# Patient Record
Sex: Male | Born: 1975 | Race: White | Hispanic: No | Marital: Married | Smoking: Former smoker
Health system: Southern US, Community
[De-identification: ages and names within clinical notes are randomized; demographics above are authoritative.]

## PROBLEM LIST (undated history)

## (undated) DIAGNOSIS — N200 Calculus of kidney: Secondary | ICD-10-CM

## (undated) DIAGNOSIS — Z87442 Personal history of urinary calculi: Secondary | ICD-10-CM

## (undated) DIAGNOSIS — F988 Other specified behavioral and emotional disorders with onset usually occurring in childhood and adolescence: Secondary | ICD-10-CM

---

## 1996-04-25 HISTORY — PX: ORIF FOREARM FRACTURE: SHX2124

## 1999-04-16 ENCOUNTER — Encounter: Payer: Self-pay | Admitting: *Deleted

## 1999-04-16 ENCOUNTER — Encounter: Admission: RE | Admit: 1999-04-16 | Discharge: 1999-04-16 | Payer: Self-pay | Admitting: *Deleted

## 2001-01-03 HISTORY — PX: APPENDECTOMY: SHX54

## 2001-04-25 HISTORY — PX: OTHER SURGICAL HISTORY: SHX169

## 2003-04-24 ENCOUNTER — Emergency Department (HOSPITAL_COMMUNITY): Admission: EM | Admit: 2003-04-24 | Discharge: 2003-04-24 | Payer: Self-pay | Admitting: Emergency Medicine

## 2015-12-18 ENCOUNTER — Other Ambulatory Visit (HOSPITAL_COMMUNITY): Payer: Self-pay | Admitting: Family Medicine

## 2015-12-18 ENCOUNTER — Ambulatory Visit (HOSPITAL_COMMUNITY)
Admission: RE | Admit: 2015-12-18 | Discharge: 2015-12-18 | Disposition: A | Discharge status: Home / Self Care | Payer: BLUE CROSS/BLUE SHIELD | Source: Ambulatory Visit | Attending: Family Medicine | Admitting: Family Medicine

## 2015-12-18 DIAGNOSIS — M25552 Pain in left hip: Secondary | ICD-10-CM

## 2015-12-18 DIAGNOSIS — G4700 Insomnia, unspecified: Secondary | ICD-10-CM | POA: Insufficient documentation

## 2016-01-05 ENCOUNTER — Other Ambulatory Visit (HOSPITAL_COMMUNITY): Payer: Self-pay | Admitting: Family Medicine

## 2016-01-05 DIAGNOSIS — M25552 Pain in left hip: Secondary | ICD-10-CM

## 2016-03-25 ENCOUNTER — Other Ambulatory Visit: Payer: Self-pay | Admitting: Physician Assistant

## 2016-03-25 ENCOUNTER — Other Ambulatory Visit (HOSPITAL_COMMUNITY): Payer: Self-pay | Admitting: Physician Assistant

## 2016-03-25 DIAGNOSIS — M795 Residual foreign body in soft tissue: Secondary | ICD-10-CM

## 2016-03-25 DIAGNOSIS — M5442 Lumbago with sciatica, left side: Secondary | ICD-10-CM

## 2016-03-25 DIAGNOSIS — M5441 Lumbago with sciatica, right side: Secondary | ICD-10-CM

## 2016-03-25 DIAGNOSIS — M25552 Pain in left hip: Secondary | ICD-10-CM

## 2016-03-28 ENCOUNTER — Other Ambulatory Visit (HOSPITAL_COMMUNITY): Payer: Self-pay | Admitting: Physician Assistant

## 2016-03-28 DIAGNOSIS — M5416 Radiculopathy, lumbar region: Secondary | ICD-10-CM

## 2016-03-30 ENCOUNTER — Ambulatory Visit (HOSPITAL_COMMUNITY): Payer: BLUE CROSS/BLUE SHIELD

## 2016-12-24 DIAGNOSIS — F988 Other specified behavioral and emotional disorders with onset usually occurring in childhood and adolescence: Secondary | ICD-10-CM

## 2016-12-24 HISTORY — DX: Other specified behavioral and emotional disorders with onset usually occurring in childhood and adolescence: F98.8

## 2017-02-07 ENCOUNTER — Emergency Department (HOSPITAL_COMMUNITY): Payer: BLUE CROSS/BLUE SHIELD

## 2017-02-07 ENCOUNTER — Encounter (HOSPITAL_COMMUNITY): Payer: Self-pay | Admitting: Emergency Medicine

## 2017-02-07 ENCOUNTER — Emergency Department (HOSPITAL_COMMUNITY)
Admission: EM | Admit: 2017-02-07 | Discharge: 2017-02-07 | Disposition: A | Discharge status: Home / Self Care | Payer: BLUE CROSS/BLUE SHIELD | Attending: Emergency Medicine | Admitting: Emergency Medicine

## 2017-02-07 DIAGNOSIS — Z79899 Other long term (current) drug therapy: Secondary | ICD-10-CM | POA: Diagnosis not present

## 2017-02-07 DIAGNOSIS — N200 Calculus of kidney: Secondary | ICD-10-CM | POA: Diagnosis not present

## 2017-02-07 DIAGNOSIS — R109 Unspecified abdominal pain: Secondary | ICD-10-CM | POA: Diagnosis present

## 2017-02-07 DIAGNOSIS — Z87891 Personal history of nicotine dependence: Secondary | ICD-10-CM | POA: Diagnosis not present

## 2017-02-07 HISTORY — DX: Calculus of kidney: N20.0

## 2017-02-07 LAB — BASIC METABOLIC PANEL
ANION GAP: 10 (ref 5–15)
BUN: 15 mg/dL (ref 6–20)
CHLORIDE: 105 mmol/L (ref 101–111)
CO2: 24 mmol/L (ref 22–32)
Calcium: 9.3 mg/dL (ref 8.9–10.3)
Creatinine, Ser: 1.41 mg/dL — ABNORMAL HIGH (ref 0.61–1.24)
GFR, EST NON AFRICAN AMERICAN: 60 mL/min — AB (ref >60)
Glucose, Bld: 123 mg/dL — ABNORMAL HIGH (ref 65–99)
Potassium: 3.7 mmol/L (ref 3.5–5.1)
SODIUM: 139 mmol/L (ref 135–145)

## 2017-02-07 LAB — CBC WITH DIFFERENTIAL/PLATELET
BASOS ABS: 0 10*3/uL (ref 0.0–0.1)
BASOS PCT: 0 %
EOS ABS: 0 10*3/uL (ref 0.0–0.7)
Eosinophils Relative: 1 %
HEMATOCRIT: 42.7 % (ref 39.0–52.0)
HEMOGLOBIN: 14.6 g/dL (ref 13.0–17.0)
Lymphocytes Relative: 9 %
Lymphs Abs: 0.7 10*3/uL (ref 0.7–4.0)
MCH: 30 pg (ref 26.0–34.0)
MCHC: 34.2 g/dL (ref 30.0–36.0)
MCV: 87.9 fL (ref 78.0–100.0)
MONOS PCT: 5 %
Monocytes Absolute: 0.4 10*3/uL (ref 0.1–1.0)
NEUTROS ABS: 7.2 10*3/uL (ref 1.7–7.7)
NEUTROS PCT: 85 %
Platelets: 156 10*3/uL (ref 150–400)
RBC: 4.86 MIL/uL (ref 4.22–5.81)
RDW: 12.9 % (ref 11.5–15.5)
WBC: 8.4 10*3/uL (ref 4.0–10.5)

## 2017-02-07 LAB — URINALYSIS, ROUTINE W REFLEX MICROSCOPIC
Bacteria, UA: NONE SEEN
Bilirubin Urine: NEGATIVE
Glucose, UA: NEGATIVE mg/dL
Hgb urine dipstick: NEGATIVE
Ketones, ur: 5 mg/dL — AB
Leukocytes, UA: NEGATIVE
Nitrite: NEGATIVE
Protein, ur: 30 mg/dL — AB
Specific Gravity, Urine: 1.028 (ref 1.005–1.030)
Squamous Epithelial / LPF: NONE SEEN
pH: 5 (ref 5.0–8.0)

## 2017-02-07 MED ORDER — ONDANSETRON HCL 4 MG/2ML IJ SOLN
4.0000 mg | Freq: Once | INTRAMUSCULAR | Status: AC
  Administered 2017-02-07: 4 mg via INTRAVENOUS
  Filled 2017-02-07: qty 2

## 2017-02-07 MED ORDER — OXYCODONE-ACETAMINOPHEN 5-325 MG PO TABS
ORAL_TABLET | ORAL | Status: AC
  Filled 2017-02-07: qty 2

## 2017-02-07 MED ORDER — OXYCODONE-ACETAMINOPHEN 5-325 MG PO TABS
1.0000 | ORAL_TABLET | Freq: Once | ORAL | Status: AC
  Administered 2017-02-07: 1 via ORAL
  Filled 2017-02-07: qty 1

## 2017-02-07 MED ORDER — HYDROMORPHONE HCL 1 MG/ML IJ SOLN
1.0000 mg | INTRAMUSCULAR | Status: DC | PRN
  Administered 2017-02-07: 1 mg via INTRAVENOUS
  Filled 2017-02-07: qty 1

## 2017-02-07 MED ORDER — TAMSULOSIN HCL 0.4 MG PO CAPS: 0.4000 mg | ORAL_CAPSULE | Freq: Every day | ORAL | 0 refills | Status: DC

## 2017-02-07 MED ORDER — HYDROMORPHONE HCL 1 MG/ML IJ SOLN
1.0000 mg | Freq: Once | INTRAMUSCULAR | Status: AC
  Administered 2017-02-07: 1 mg via INTRAVENOUS
  Filled 2017-02-07: qty 1

## 2017-02-07 MED ORDER — OXYCODONE-ACETAMINOPHEN 5-325 MG PO TABS
2.0000 | ORAL_TABLET | Freq: Once | ORAL | Status: AC
  Administered 2017-02-07: 2 via ORAL

## 2017-02-07 MED ORDER — OXYCODONE-ACETAMINOPHEN 5-325 MG PO TABS: 2.0000 | ORAL_TABLET | ORAL | 0 refills | Status: DC | PRN

## 2017-02-07 MED ORDER — HYDROMORPHONE HCL 1 MG/ML IJ SOLN
0.5000 mg | Freq: Once | INTRAMUSCULAR | Status: AC
  Administered 2017-02-07: 0.5 mg via INTRAVENOUS
  Filled 2017-02-07: qty 1

## 2017-02-07 MED ORDER — ONDANSETRON 4 MG PO TBDP: 4.0000 mg | ORAL_TABLET | Freq: Three times a day (TID) | ORAL | 0 refills | Status: DC | PRN

## 2017-02-07 NOTE — ED Notes (Signed)
Patient given discharge instructions and verbalized understanding.  Patient stable to discharge at this time.  Patient is alert and oriented to baseline.  No distressed noted at this time.  All belongings taken with the patient at discharge.   

## 2017-02-07 NOTE — Discharge Instructions (Signed)
Call Dr. Alphonsa Overall office [facility]) for appointment. Tell the office that Dr. Fayrene Fearing spoke with Dr. Liliane Shi Re: Next/first available provider appointment., Tylenol OR percocet for pain Push fluids.

## 2017-02-07 NOTE — ED Triage Notes (Signed)
Pt sts that he has kidney stones with [place]. Pt sts he has not passed the stone and has the same pain which has increased. Pt sts that he has difficulty and pain with urination. Pain is on L side through to back.

## 2017-02-07 NOTE — ED Notes (Signed)
Pt States having a ride in the lobby to take him home

## 2017-02-08 ENCOUNTER — Ambulatory Visit (HOSPITAL_COMMUNITY)
Admission: AD | Admit: 2017-02-08 | Discharge: 2017-02-08 | Disposition: A | Discharge status: Home / Self Care | Payer: BLUE CROSS/BLUE SHIELD | Source: Ambulatory Visit | Attending: Urology | Admitting: Urology

## 2017-02-08 ENCOUNTER — Ambulatory Visit (HOSPITAL_COMMUNITY): Payer: BLUE CROSS/BLUE SHIELD

## 2017-02-08 ENCOUNTER — Inpatient Hospital Stay (HOSPITAL_COMMUNITY): Payer: BLUE CROSS/BLUE SHIELD | Admitting: Certified Registered Nurse Anesthetist

## 2017-02-08 ENCOUNTER — Other Ambulatory Visit: Payer: Self-pay | Admitting: Urology

## 2017-02-08 ENCOUNTER — Encounter (HOSPITAL_COMMUNITY)
Admission: AD | Disposition: A | Discharge status: Home / Self Care | Payer: Self-pay | Source: Ambulatory Visit | Attending: Urology

## 2017-02-08 ENCOUNTER — Encounter (HOSPITAL_COMMUNITY): Payer: Self-pay | Admitting: *Deleted

## 2017-02-08 DIAGNOSIS — Z87891 Personal history of nicotine dependence: Secondary | ICD-10-CM | POA: Diagnosis not present

## 2017-02-08 DIAGNOSIS — N209 Urinary calculus, unspecified: Secondary | ICD-10-CM | POA: Diagnosis present

## 2017-02-08 DIAGNOSIS — N201 Calculus of ureter: Secondary | ICD-10-CM | POA: Diagnosis not present

## 2017-02-08 DIAGNOSIS — Z841 Family history of disorders of kidney and ureter: Secondary | ICD-10-CM | POA: Insufficient documentation

## 2017-02-08 DIAGNOSIS — Z823 Family history of stroke: Secondary | ICD-10-CM | POA: Diagnosis not present

## 2017-02-08 DIAGNOSIS — Z79899 Other long term (current) drug therapy: Secondary | ICD-10-CM | POA: Diagnosis not present

## 2017-02-08 HISTORY — PX: CYSTOSCOPY/RETROGRADE/URETEROSCOPY/STONE EXTRACTION WITH BASKET: SHX5317

## 2017-02-08 HISTORY — PX: HOLMIUM LASER APPLICATION: SHX5852

## 2017-02-08 HISTORY — DX: Personal history of urinary calculi: Z87.442

## 2017-02-08 HISTORY — DX: Other specified behavioral and emotional disorders with onset usually occurring in childhood and adolescence: F98.8

## 2017-02-08 LAB — BASIC METABOLIC PANEL
Anion gap: 10 (ref 5–15)
BUN: 15 mg/dL (ref 6–20)
CO2: 26 mmol/L (ref 22–32)
Calcium: 9.1 mg/dL (ref 8.9–10.3)
Chloride: 105 mmol/L (ref 101–111)
Creatinine, Ser: 1.37 mg/dL — ABNORMAL HIGH (ref 0.61–1.24)
GFR calc Af Amer: >60 mL/min (ref >60)
GLUCOSE: 98 mg/dL (ref 65–99)
POTASSIUM: 3.9 mmol/L (ref 3.5–5.1)
Sodium: 141 mmol/L (ref 135–145)

## 2017-02-08 SURGERY — CYSTOSCOPY, WITH CALCULUS REMOVAL USING BASKET
Anesthesia: General | Site: Bladder | Laterality: Left

## 2017-02-08 MED ORDER — PROPOFOL 10 MG/ML IV BOLUS
INTRAVENOUS | Status: DC | PRN
  Administered 2017-02-08: 200 mg via INTRAVENOUS
  Administered 2017-02-08: 100 mg via INTRAVENOUS

## 2017-02-08 MED ORDER — HYDROMORPHONE HCL-NACL 0.5-0.9 MG/ML-% IV SOSY
0.2500 mg | PREFILLED_SYRINGE | INTRAVENOUS | Status: DC | PRN
  Administered 2017-02-08: 0.5 mg via INTRAVENOUS

## 2017-02-08 MED ORDER — HYDROMORPHONE HCL-NACL 0.5-0.9 MG/ML-% IV SOSY
PREFILLED_SYRINGE | INTRAVENOUS | Status: AC
  Filled 2017-02-08: qty 1

## 2017-02-08 MED ORDER — DEXAMETHASONE SODIUM PHOSPHATE 10 MG/ML IJ SOLN
INTRAMUSCULAR | Status: AC
  Filled 2017-02-08: qty 1

## 2017-02-08 MED ORDER — FENTANYL CITRATE (PF) 100 MCG/2ML IJ SOLN
INTRAMUSCULAR | Status: DC | PRN
  Administered 2017-02-08 (×2): 50 ug via INTRAVENOUS

## 2017-02-08 MED ORDER — MIDAZOLAM HCL 2 MG/2ML IJ SOLN
INTRAMUSCULAR | Status: AC
  Filled 2017-02-08: qty 2

## 2017-02-08 MED ORDER — PROMETHAZINE HCL 25 MG/ML IJ SOLN: 6.2500 mg | INTRAMUSCULAR | Status: DC | PRN

## 2017-02-08 MED ORDER — OXYCODONE HCL 5 MG PO TABS: 5.0000 mg | ORAL_TABLET | Freq: Three times a day (TID) | ORAL | 0 refills | Status: AC | PRN

## 2017-02-08 MED ORDER — LIDOCAINE HCL 1 % IJ SOLN
INTRAMUSCULAR | Status: DC | PRN
  Administered 2017-02-08: 50 mg via INTRADERMAL

## 2017-02-08 MED ORDER — LACTATED RINGERS IV SOLN
INTRAVENOUS | Status: DC
  Administered 2017-02-08: 17:00:00 via INTRAVENOUS

## 2017-02-08 MED ORDER — OXYCODONE HCL 5 MG PO TABS
ORAL_TABLET | ORAL | Status: AC
  Filled 2017-02-08: qty 1

## 2017-02-08 MED ORDER — ONDANSETRON HCL 4 MG/2ML IJ SOLN
INTRAMUSCULAR | Status: DC | PRN
  Administered 2017-02-08: 4 mg via INTRAVENOUS

## 2017-02-08 MED ORDER — LIDOCAINE 2% (20 MG/ML) 5 ML SYRINGE
INTRAMUSCULAR | Status: AC
  Filled 2017-02-08: qty 5

## 2017-02-08 MED ORDER — DEXAMETHASONE SODIUM PHOSPHATE 10 MG/ML IJ SOLN
INTRAMUSCULAR | Status: DC | PRN
  Administered 2017-02-08: 10 mg via INTRAVENOUS

## 2017-02-08 MED ORDER — PROPOFOL 10 MG/ML IV BOLUS
INTRAVENOUS | Status: AC
  Filled 2017-02-08: qty 20

## 2017-02-08 MED ORDER — CEFAZOLIN SODIUM-DEXTROSE 2-4 GM/100ML-% IV SOLN
2.0000 g | Freq: Once | INTRAVENOUS | Status: AC
  Administered 2017-02-08: 2 g via INTRAVENOUS
  Filled 2017-02-08: qty 100

## 2017-02-08 MED ORDER — OXYCODONE HCL 5 MG/5ML PO SOLN: 5.0000 mg | Freq: Once | ORAL | Status: AC | PRN

## 2017-02-08 MED ORDER — PHENAZOPYRIDINE HCL 100 MG PO TABS: 100.0000 mg | ORAL_TABLET | Freq: Three times a day (TID) | ORAL | 0 refills | Status: DC | PRN

## 2017-02-08 MED ORDER — OXYCODONE HCL 5 MG PO TABS
5.0000 mg | ORAL_TABLET | Freq: Once | ORAL | Status: AC | PRN
  Administered 2017-02-08: 5 mg via ORAL

## 2017-02-08 MED ORDER — MIDAZOLAM HCL 5 MG/5ML IJ SOLN
INTRAMUSCULAR | Status: DC | PRN
  Administered 2017-02-08: 2 mg via INTRAVENOUS

## 2017-02-08 MED ORDER — ONDANSETRON HCL 4 MG/2ML IJ SOLN
INTRAMUSCULAR | Status: AC
  Filled 2017-02-08: qty 2

## 2017-02-08 MED ORDER — FENTANYL CITRATE (PF) 100 MCG/2ML IJ SOLN
INTRAMUSCULAR | Status: AC
  Filled 2017-02-08: qty 2

## 2017-02-08 MED ORDER — IOHEXOL 300 MG/ML  SOLN
INTRAMUSCULAR | Status: DC | PRN
  Administered 2017-02-08: 3 mL via INTRAVENOUS

## 2017-02-08 SURGICAL SUPPLY — 20 items
BAG URO CATCHER STRL LF (MISCELLANEOUS) ×3 IMPLANT
BASKET ZERO TIP NITINOL 2.4FR (BASKET) ×3 IMPLANT
CATH INTERMIT  6FR 70CM (CATHETERS) IMPLANT
CLOTH BEACON ORANGE TIMEOUT ST (SAFETY) ×3 IMPLANT
COVER FOOTSWITCH UNIV (MISCELLANEOUS) IMPLANT
COVER SURGICAL LIGHT HANDLE (MISCELLANEOUS) ×3 IMPLANT
FIBER LASER FLEXIVA 365 (UROLOGICAL SUPPLIES) ×3 IMPLANT
FIBER LASER TRAC TIP (UROLOGICAL SUPPLIES) IMPLANT
GLOVE BIOGEL M STRL SZ7.5 (GLOVE) ×3 IMPLANT
GOWN STRL REUS W/TWL LRG LVL3 (GOWN DISPOSABLE) ×6 IMPLANT
GUIDEWIRE ANG ZIPWIRE 038X150 (WIRE) IMPLANT
GUIDEWIRE STR DUAL SENSOR (WIRE) ×3 IMPLANT
IV NS 1000ML (IV SOLUTION) ×2
IV NS 1000ML BAXH (IV SOLUTION) ×1 IMPLANT
MANIFOLD NEPTUNE II (INSTRUMENTS) ×3 IMPLANT
PACK CYSTO (CUSTOM PROCEDURE TRAY) ×3 IMPLANT
SHEATH ACCESS URETERAL 38CM (SHEATH) IMPLANT
STENT URET 6FRX26 CONTOUR (STENTS) ×3 IMPLANT
TUBING CONNECTING 10 (TUBING) ×2 IMPLANT
TUBING CONNECTING 10' (TUBING) ×1

## 2017-02-08 NOTE — Anesthesia Postprocedure Evaluation (Signed)
Anesthesia Post Note  Patient: Donald ChromanJoseph N Peck  Procedure(s) Performed: CYSTOSCOPY/LEFT RETROGRADE/LEFT URETEROSCOPY/STONE EXTRACTION WITH BASKET/LEFT URETERAL STENT PLACEMENT (Left Bladder) HOLMIUM LASER APPLICATION (Left Bladder)     Patient location during evaluation: PACU Anesthesia Type: General Level of consciousness: awake and alert Pain management: pain level controlled Vital Signs Assessment: post-procedure vital signs reviewed and stable Respiratory status: spontaneous breathing, nonlabored ventilation and respiratory function stable Cardiovascular status: blood pressure returned to baseline and stable Postop Assessment: no apparent nausea or vomiting Anesthetic complications: no    Last Vitals:  Vitals:   02/08/17 1908 02/08/17 1930  BP: (!) 136/98 (!) 134/94  Pulse: 76 61  Resp: 18 17  Temp: 36.4 C   SpO2: 100% 97%    Last Pain:  Vitals:   02/08/17 1930  TempSrc:   PainSc: 5                  Lowella CurbWarren Ray Jaquanna Ballentine

## 2017-02-08 NOTE — Anesthesia Preprocedure Evaluation (Signed)
Anesthesia Evaluation  Patient identified by MRN, date of birth, ID band Patient awake    Reviewed: Allergy & Precautions, NPO status , Patient's Chart, lab work & pertinent test results  Airway Mallampati: II  TM Distance: >3 FB Neck ROM: Full    Dental no notable dental hx.    Pulmonary neg pulmonary ROS, former smoker,    Pulmonary exam normal breath sounds clear to auscultation       Cardiovascular negative cardio ROS Normal cardiovascular exam Rhythm:Regular Rate:Normal     Neuro/Psych Depression negative neurological ROS  negative psych ROS   GI/Hepatic negative GI ROS, Neg liver ROS,   Endo/Other  negative endocrine ROS  Renal/GU Renal diseasenegative Renal ROS  negative genitourinary   Musculoskeletal negative musculoskeletal ROS (+)   Abdominal   Peds negative pediatric ROS (+)  Hematology negative hematology ROS (+)   Anesthesia Other Findings   Reproductive/Obstetrics negative OB ROS                             Anesthesia Physical Anesthesia Plan  ASA: II  Anesthesia Plan: General   Post-op Pain Management:    Induction: Intravenous  PONV Risk Score and Plan: 2 and Ondansetron and Midazolam  Airway Management Planned: LMA  Additional Equipment:   Intra-op Plan:   Post-operative Plan: Extubation in OR  Informed Consent: I have reviewed the patients History and Physical, chart, labs and discussed the procedure including the risks, benefits and alternatives for the proposed anesthesia with the patient or authorized representative who has indicated his/her understanding and acceptance.   Dental advisory given  Plan Discussed with: CRNA  Anesthesia Plan Comments:         Anesthesia Quick Evaluation

## 2017-02-08 NOTE — Op Note (Signed)
Preoperative diagnosis: Left ureteral calculus  Postoperative diagnosis: Left ureteral calculus  Procedure:  1. Cystoscopy 2. Left ureteroscopy and stone removal 3. Ureteroscopic laser lithotripsy 4. Left ureteral stent placement (6 x 26 with string)  5. Left retrograde pyelography with interpretation  Surgeon: Moody BruinsLester S. Issis Lindseth, Jr. M.D.  Anesthesia: General  Complications: None  Intraoperative findings: Left retrograde pyelography demonstrated a filling defect within the distal left ureter consistent with the patient's known calculus without other abnormalities.  EBL: Minimal  Specimens: 1. Left ureteral calculus  Disposition of specimens: [facility] for stone analysis  Indication: Loman ChromanJoseph N Everingham is a 41 y.o. year old patient with urolithiasis.  He has a symptomatic distal left ureteral stone. After reviewing the management options for treatment, the patient elected to proceed with the above surgical procedure(s). We have discussed the potential benefits and risks of the procedure, side effects of the proposed treatment, the likelihood of the patient achieving the goals of the procedure, and any potential problems that might occur during the procedure or recuperation. Informed consent has been obtained.  Description of procedure:  The patient was taken to the operating room and general anesthesia was induced.  The patient was placed in the dorsal lithotomy position, prepped and draped in the usual sterile fashion, and preoperative antibiotics were administered. A preoperative time-out was performed.   Cystourethroscopy was performed.  The patient's urethra was examined and was normal. The bladder was then systematically examined in its entirety. There was no evidence for any bladder tumors, stones, or other mucosal pathology.    Attention then turned to the left ureteral orifice and a ureteral catheter was used to intubate the ureteral orifice.  Omnipaque  contrast was injected through the ureteral catheter and a retrograde pyelogram was performed with findings as dictated above.  A 0.38 sensor guidewire was then advanced up the left ureter into the renal pelvis under fluoroscopic guidance. The 6 Fr semirigid ureteroscope was then advanced into the ureter next to the guidewire and the calculus was identified.   The stone was then fragmented with the 365 micron holmium laser fiber on a setting of 0.6 J and frequency of 6 Hz.   All stones were then removed from the ureter with a zero tip nitinol basket.  Reinspection of the ureter revealed no remaining visible stones or fragments.   The wire was then backloaded through the cystoscope and a ureteral stent was advance over the wire using Seldinger technique.  The stent was positioned appropriately under fluoroscopic and cystoscopic guidance.  The wire was then removed with an adequate stent curl noted in the renal pelvis as well as in the bladder.  The bladder was then emptied and the procedure ended.  The patient appeared to tolerate the procedure well and without complications.  The patient was able to be awakened and transferred to the recovery unit in satisfactory condition.

## 2017-02-08 NOTE — Discharge Instructions (Addendum)
1. You may see some blood in the urine and may have some burning with urination for 48-72 hours. You also may notice that you have to urinate more frequently or urgently after your procedure which is normal.  2. You should call should you develop an inability urinate, fever > 101, persistent nausea and vomiting that prevents you from eating or drinking to stay hydrated.  3. If you have a stent, you will likely urinate more frequently and urgently until the stent is removed and you may experience some discomfort/pain in the lower abdomen and flank especially when urinating. You may take pain medication prescribed to you if needed for pain. You may also intermittently have blood in the urine until the stent is removed.       4. You may remove your stent on Sunday morning.  Simply pull the string that is taped to your body and the stent will easily come out.  This may be best done in the shower as some urine may come out with the stent.  Usually you will feel relief once the stent is removed, but occasionally patients can develop pain due to residual swelling of the ureter that may temporarily obstruct the kidney.  This can be managed by taking pain medication and it will typically resolve with time.  Please do not hesitate to call if you have pain that is not controlled with your pain medication or does not improved within 24-48 hours.  

## 2017-02-08 NOTE — Transfer of Care (Signed)
Immediate Anesthesia Transfer of Care Note  Patient: Donald Peck  Procedure(s) Performed: Procedure(s): CYSTOSCOPY/LEFT RETROGRADE/LEFT URETEROSCOPY/STONE EXTRACTION WITH BASKET/LEFT URETERAL STENT PLACEMENT (Left) HOLMIUM LASER APPLICATION (Left)  Patient Location: PACU  Anesthesia Type:General  Level of Consciousness:  sedated, patient cooperative and responds to stimulation  Airway & Oxygen Therapy:Patient Spontanous Breathing and Patient connected to face mask oxgen  Post-op Assessment:  Report given to PACU RN and Post -op Vital signs reviewed and stable  Post vital signs:  Reviewed and stable  Last Vitals:  Vitals:   02/08/17 1633 02/08/17 1908  BP: 130/87   Pulse: (!) 52 (P) 76  Resp: 18 (P) 18  Temp: 36.7 C (P) 36.4 C  SpO2: 99% (P) 100%    Complications: No apparent anesthesia complications

## 2017-02-08 NOTE — H&P (Signed)
Office Visit Report     02/08/2017   --------------------------------------------------------------------------------   Donald Peck  MRN: [id number]  PRIMARY CARE:  Donald Found, MD  DOB: 07/10/1975, 41 year old Male  REFERRING:    SSN: [id number]  PROVIDER:  Heloise Peck, M.D.    LOCATION:  [facility] [phone]   --------------------------------------------------------------------------------   CC/HPI: Left ureteral calculus   Donald Peck is a healthy 41 year old gentleman who initially developed pain a few months ago. He had a CT scan performed 3 months ago that demonstrated a distal left ureteral calculus. This pain has been intermittent since then and recently worsened over the past week prompting a repeat CT scan in the emergency department yesterday. This confirmed a 6 x 4 mm distal left ureteral stone. He has had some mild nausea and vomiting. He denies any fevers. He denies any hematuria. He has no prior history of kidney stones.     ALLERGIES: None   MEDICATIONS: Flomax 0.4 mg capsule, ext release 24 hr  Lexapro 10 mg tablet  Oxycodone-Acetaminophen 5 mg-325 mg tablet  Tylenol  Zofran 4 mg tablet     GU PSH: None     PSH Notes: Broken arm-plate in [id number]   NON-GU PSH: Appendectomy (laparoscopic), 2003    GU PMH: None   NON-GU PMH: None   FAMILY HISTORY: Heart Attack - Father nephrolithiasis - Father stroke - Father   SOCIAL HISTORY: Marital Status: Married Preferred Language: English; Ethnicity: Not Hispanic Or Latino; Race: White Current Smoking Status: Patient does not smoke anymore. Has not smoked since 01/23/2006. Smoked for 10 years. Smoked 1 pack per day.   Tobacco Use Assessment Completed: Used Tobacco in last 30 days? Does drink.  Drinks 3 caffeinated drinks per day. Patient's occupation Scientific laboratory technician.    REVIEW OF SYSTEMS:    GU Review Male:   Patient reports frequent urination and get up at night to urinate.  Patient denies hard to postpone urination, burning/ pain with urination, leakage of urine, stream starts and stops, trouble starting your streams, and have to strain to urinate .  Gastrointestinal (Lower):   Patient denies diarrhea and constipation.  Gastrointestinal (Upper):   Patient reports nausea and vomiting.   Constitutional:   Patient reports fatigue. Patient denies fever, night sweats, and weight loss.  Skin:   Patient denies skin rash/ lesion and itching.  Eyes:   Patient denies blurred vision and double vision.  Ears/ Nose/ Throat:   Patient denies sore throat and sinus problems.  Hematologic/Lymphatic:   Patient denies swollen glands and easy bruising.  Cardiovascular:   Patient denies leg swelling and chest pains.  Respiratory:   Patient denies cough and shortness of breath.  Endocrine:   Patient denies excessive thirst.  Musculoskeletal:   Patient reports back pain. Patient denies joint pain.  Neurological:   Patient denies headaches and dizziness.  Psychologic:   Patient denies depression and anxiety.   VITAL SIGNS:      02/08/2017 11:31 AM  Weight 202 lb / 91.63 kg  Height 72 in / 182.88 cm  BP 125/74 mmHg  Pulse 50 /min  Temperature 98.1 F / 36.7 C  BMI 27.4 kg/m   MULTI-SYSTEM PHYSICAL EXAMINATION:    Constitutional: Well-nourished. No physical deformities. Normally developed. Good grooming.  Neck: Neck symmetrical, not swollen. Normal tracheal position.  Respiratory: No labored breathing, no use of accessory muscles. Clear bilaterally.  Cardiovascular: Normal temperature, normal extremity pulses, no swelling, no varicosities. RRR.  Lymphatic: No enlargement of neck, axillae, groin.  Skin: No paleness, no jaundice, no cyanosis. No lesion, no ulcer, no rash.  Neurologic / Psychiatric: Oriented to time, oriented to place, oriented to person. No depression, no anxiety, no agitation.  Gastrointestinal: No mass, no tenderness, no rigidity, non obese abdomen.  Eyes:  Normal conjunctivae. Normal eyelids.  Ears, Nose, Mouth, and Throat: Left ear no scars, no lesions, no masses. Right ear no scars, no lesions, no masses. Nose no scars, no lesions, no masses. Normal hearing. Normal lips.  Musculoskeletal: Normal gait and station of head and neck.     PAST DATA REVIEWED:  Source Of History:  Patient  Lab Test Review:   BMP  Records Review:   Previous Patient Records  Urine Test Review:   Urinalysis  X-Ray Review: C.T. Abdomen/Pelvis: Reviewed Films.    Notes:                     Cr 1.4  WBC 8.4  Hgb 14.6   PROCEDURES: None   ASSESSMENT:      ICD-10 Details  1 GU:   Ureteral calculus - N20.1    PLAN:           Schedule Return Visit/Planned Activity: Keep Scheduled Appointment          Document Letter(s):  Created for Patient: Clinical Summary         Notes:   1. Left ureteral calculus: We reviewed options. Based on the length of time that the stone has been present, he understands that he is unlikely to pass it although I did offer him the option of medical expulsion therapy. Ultimately, he does wish to proceed with definitive therapy and we reviewed options including ureteroscopic laser lithotripsy and shockwave lithotripsy. After reviewing the pros and cons of each approach, he does wish to proceed with definitive left ureteroscopic laser lithotripsy this evening. We have reviewed the potential risks, consultations, and the expected recovery process. He gives informed consent to proceed.   Cc: Dr. Assunta FoundJohn Peck

## 2017-02-08 NOTE — Anesthesia Procedure Notes (Signed)
Procedure Name: LMA Insertion Date/Time: 02/08/2017 6:18 PM Performed by: Durward ParcelFLYNN-COOK, Aijalon Kirtz A Pre-anesthesia Checklist: Patient identified, Emergency Drugs available, Suction available, Patient being monitored and Timeout performed Patient Re-evaluated:Patient Re-evaluated prior to induction Oxygen Delivery Method: Circle system utilized Preoxygenation: Pre-oxygenation with 100% oxygen Induction Type: IV induction Ventilation: Mask ventilation without difficulty LMA: LMA inserted LMA Size: 4.0 Number of attempts: 1 Tube secured with: Tape Dental Injury: Teeth and Oropharynx as per pre-operative assessment

## 2017-02-09 ENCOUNTER — Encounter (HOSPITAL_COMMUNITY): Payer: Self-pay | Admitting: Urology

## 2017-02-12 NOTE — ED Provider Notes (Signed)
[facility] EMERGENCY DEPARTMENT Provider Note   CSN: [id number] Arrival date & time: 02/07/17  4098     History   Chief Complaint Chief Complaint  Patient presents with  . Abdominal Pain    HPI Donald Peck is a 41 y.o. male. 55 old male. Complains of flank pain. Evaluated in [place], [place] several months ago. States she's had persistent pain. Some days are mild symptoms are severe. He sat worsening pain over the last 2-3 days. Continues to make urine. Nonbloody. Occasional nausea. No fever. Otherwise healthy male.  HPI  Past Medical History:  Diagnosis Date  . ADD (attention deficit disorder) 12/2016   takes lexapro to help focus  . History of kidney stones   . Kidney stones     There are no active problems to display for this patient.   Past Surgical History:  Procedure Laterality Date  . APPENDECTOMY  01/03/2001  . CYSTOSCOPY/RETROGRADE/URETEROSCOPY/STONE EXTRACTION WITH BASKET Left 02/08/2017   Procedure: CYSTOSCOPY/LEFT RETROGRADE/LEFT URETEROSCOPY/STONE EXTRACTION WITH BASKET/LEFT URETERAL STENT PLACEMENT;  Surgeon: Heloise Purpura, MD;  Location: [facility];  Service: Urology;  Laterality: Left;  . HOLMIUM LASER APPLICATION Left 02/08/2017   Procedure: HOLMIUM LASER APPLICATION;  Surgeon: Heloise Purpura, MD;  Location: [facility];  Service: Urology;  Laterality: Left;  . ORIF FOREARM FRACTURE Right 1998  . removal of right forearm hardware Right 2003       Home Medications    Prior to Admission medications   Medication Sig Start Date End Date Taking? Authorizing Provider  acetaminophen (TYLENOL) 325 MG tablet Take 325-650 mg by mouth every 6 (six) hours as needed (for pain or headaches).   Yes [provider]  escitalopram (LEXAPRO) 10 MG tablet Take 10 mg by mouth daily. 01/28/17  Yes [provider]  ondansetron (ZOFRAN ODT) 4 MG disintegrating tablet Take 1 tablet (4 mg total) by mouth every 8 (eight) hours as  needed for nausea. 02/07/17   Rolland Porter, MD  oxyCODONE (ROXICODONE) 5 MG immediate release tablet Take 1 tablet (5 mg total) by mouth every 8 (eight) hours as needed. 02/08/17 02/08/18  Heloise Purpura, MD  oxyCODONE-acetaminophen (PERCOCET/ROXICET) 5-325 MG tablet Take 2 tablets by mouth every 4 (four) hours as needed. 02/07/17   Rolland Porter, MD  phenazopyridine (PYRIDIUM) 100 MG tablet Take 1 tablet (100 mg total) by mouth 3 (three) times daily as needed for pain (for burning). 02/08/17   Heloise Purpura, MD    Family History No family history on file.  Social History Social History  Substance Use Topics  . Smoking status: Former Smoker    Packs/day: 1.00    Years: 10.00    Types: Cigarettes    Quit date: 02/08/2006  . Smokeless tobacco: Never Used  . Alcohol use Yes     Comment: occassional     Allergies   Patient has no known allergies.   Review of Systems Review of Systems  Constitutional: Negative for appetite change, chills, diaphoresis, fatigue and fever.  HENT: Negative for mouth sores, sore throat and trouble swallowing.   Eyes: Negative for visual disturbance.  Respiratory: Negative for cough, chest tightness, shortness of breath and wheezing.   Cardiovascular: Negative for chest pain.  Gastrointestinal: Positive for nausea. Negative for abdominal distention, abdominal pain, diarrhea and vomiting.  Endocrine: Negative for polydipsia, polyphagia and polyuria.  Genitourinary: Positive for flank pain. Negative for dysuria, frequency and hematuria.  Musculoskeletal: Negative for gait problem.  Skin: Negative for color change, pallor  and rash.  Neurological: Negative for dizziness, syncope, light-headedness and headaches.  Hematological: Does not bruise/bleed easily.  Psychiatric/Behavioral: Negative for behavioral problems and confusion.     Physical Exam Updated Vital Signs BP (!) 132/94   Pulse (!) 51   Temp 98 F (36.7 C) (Oral)   Resp 18   Ht 6' (1.829  m)   Wt 95.3 kg (210 lb)   SpO2 99%   BMI 28.48 kg/m   Physical Exam  Constitutional: He is oriented to person, place, and time. He appears well-developed and well-nourished. No distress.  Appears uncomfortable. Shifting side-to-side. Standing. No pain with movement. Abdomen soft benign. No reproducible tenderness.  HENT:  Head: Normocephalic.  Eyes: Pupils are equal, round, and reactive to light. Conjunctivae are normal. No scleral icterus.  Neck: Normal range of motion. Neck supple. No thyromegaly present.  Cardiovascular: Normal rate and regular rhythm.  Exam reveals no gallop and no friction rub.   No murmur heard. Pulmonary/Chest: Effort normal and breath sounds normal. No respiratory distress. He has no wheezes. He has no rales.  Abdominal: Soft. Bowel sounds are normal. He exhibits no distension. There is no tenderness. There is no rebound.  Musculoskeletal: Normal range of motion.  Neurological: He is alert and oriented to person, place, and time.  Skin: Skin is warm and dry. No rash noted.  Psychiatric: He has a normal mood and affect. His behavior is normal.     ED Treatments / Results  Labs (all labs ordered are listed, but only abnormal results are displayed) Labs Reviewed  URINALYSIS, ROUTINE W REFLEX MICROSCOPIC - Abnormal; Notable for the following:       Result Value   Ketones, ur 5 (*)    Protein, ur 30 (*)    All other components within normal limits  BASIC METABOLIC PANEL - Abnormal; Notable for the following:    Glucose, Bld 123 (*)    Creatinine, Ser 1.41 (*)    GFR calc non Af Amer 60 (*)    All other components within normal limits  CBC WITH DIFFERENTIAL/PLATELET    EKG  EKG Interpretation None       Radiology No results found.  Procedures Procedures (including critical care time)  Medications Ordered in ED Medications  oxyCODONE-acetaminophen (PERCOCET/ROXICET) 5-325 MG per tablet 2 tablet (2 tablets Oral Given 02/07/17 0955)    ondansetron (ZOFRAN) injection 4 mg (4 mg Intravenous Given 02/07/17 1143)  HYDROmorphone (DILAUDID) injection 1 mg (1 mg Intravenous Given 02/07/17 1247)  HYDROmorphone (DILAUDID) injection 0.5 mg (0.5 mg Intravenous Given 02/07/17 1537)  oxyCODONE-acetaminophen (PERCOCET/ROXICET) 5-325 MG per tablet 1 tablet (1 tablet Oral Given 02/07/17 1537)     Initial Impression / Assessment and Plan / ED Course  I have reviewed the triage vital signs and the nursing notes.  Pertinent labs & imaging results that were available during my care of the patient were reviewed by me and considered in my medical decision making (see chart for details).    CT scan shows large distal stone. Symptoms are well-controlled. Urine does not appear infected. Creatinine was 1.6 in July. 1.4 today. I discussed case with urology on call. He will be seen in their office for follow-up planned extraction.   Final Clinical Impressions(s) / ED Diagnoses   Final diagnoses:  Kidney stone    New Prescriptions Discharge Medication List as of 02/07/2017  3:41 PM    START taking these medications   Details  ondansetron (ZOFRAN ODT) 4 MG disintegrating tablet  Take 1 tablet (4 mg total) by mouth every 8 (eight) hours as needed for nausea., Starting Tue 02/07/2017, Print    oxyCODONE-acetaminophen (PERCOCET/ROXICET) 5-325 MG tablet Take 2 tablets by mouth every 4 (four) hours as needed., Starting Tue 02/07/2017, Print    tamsulosin (FLOMAX) 0.4 MG CAPS capsule Take 1 capsule (0.4 mg total) by mouth daily., Starting Tue 02/07/2017, Print         Rolland Porter, MD 02/12/17 [phone]

## 2017-08-23 IMAGING — DX DG HIP (WITH OR WITHOUT PELVIS) 4+V*L*
3 series · 3 of 3 positions shown · non-contrast
Comparison: None.

CLINICAL DATA: Pain posterior left hip x 8 months/no known injury

EXAM:
DG HIP (WITH OR WITHOUT PELVIS) 4+V LEFT

[pelvis ap]
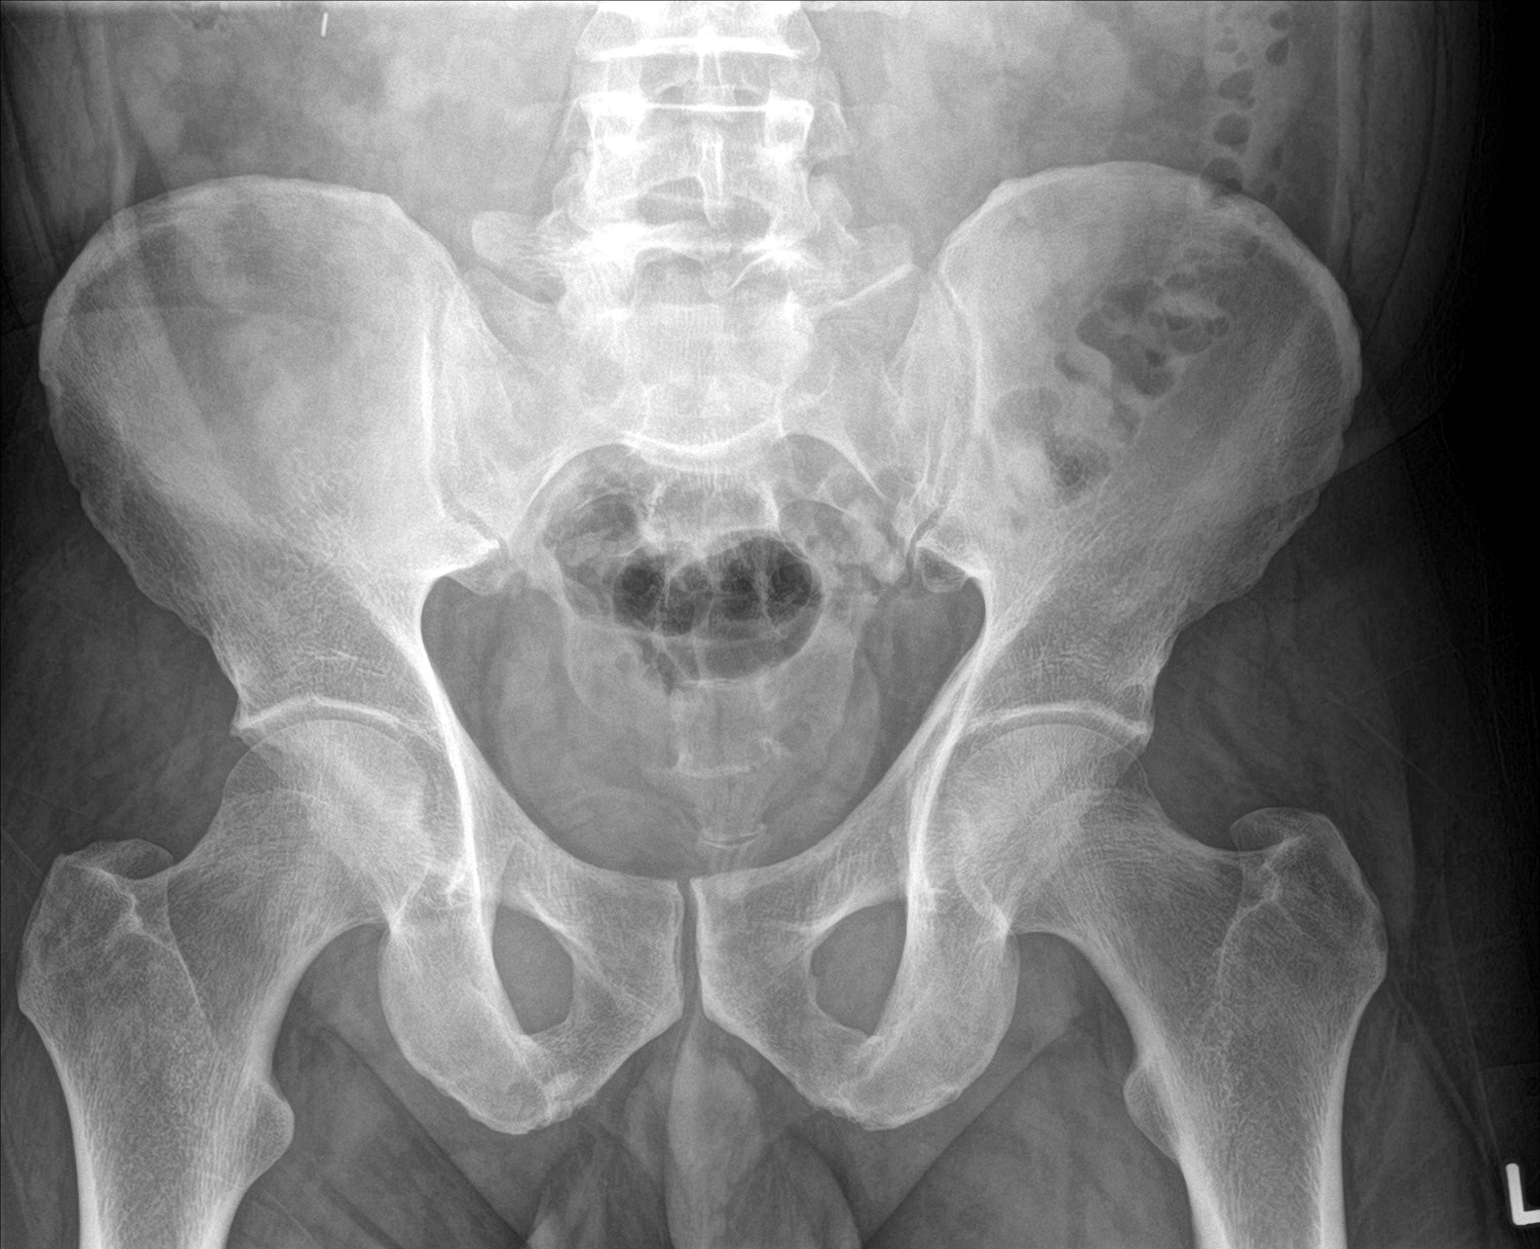

[hip ap]
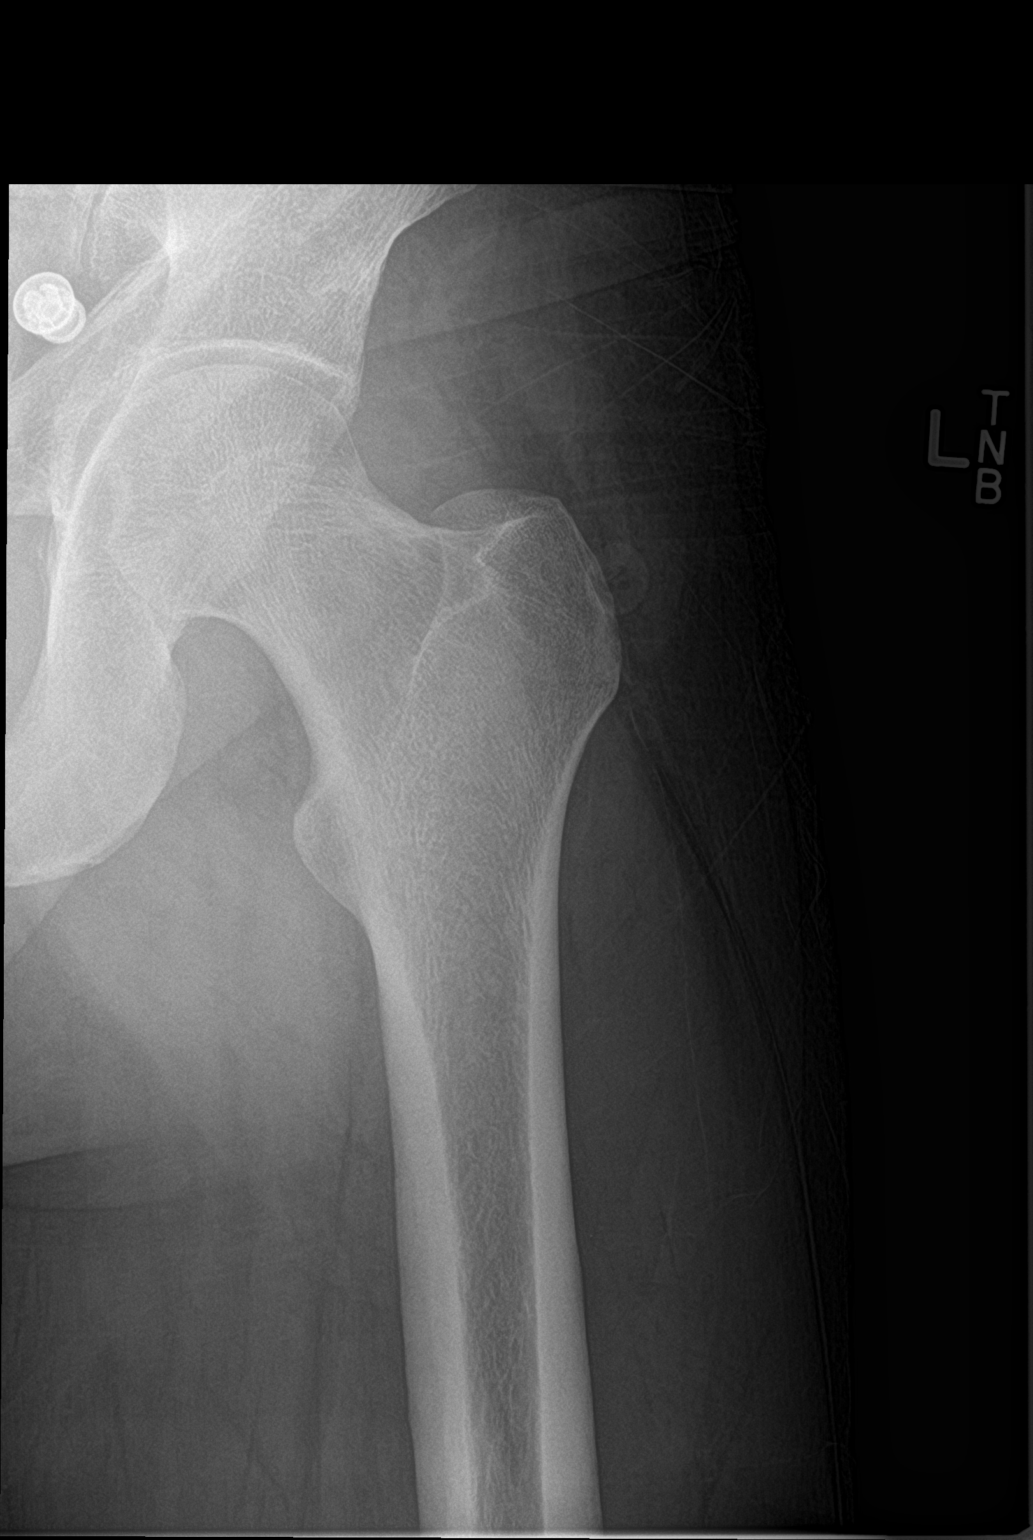

[hip lat]
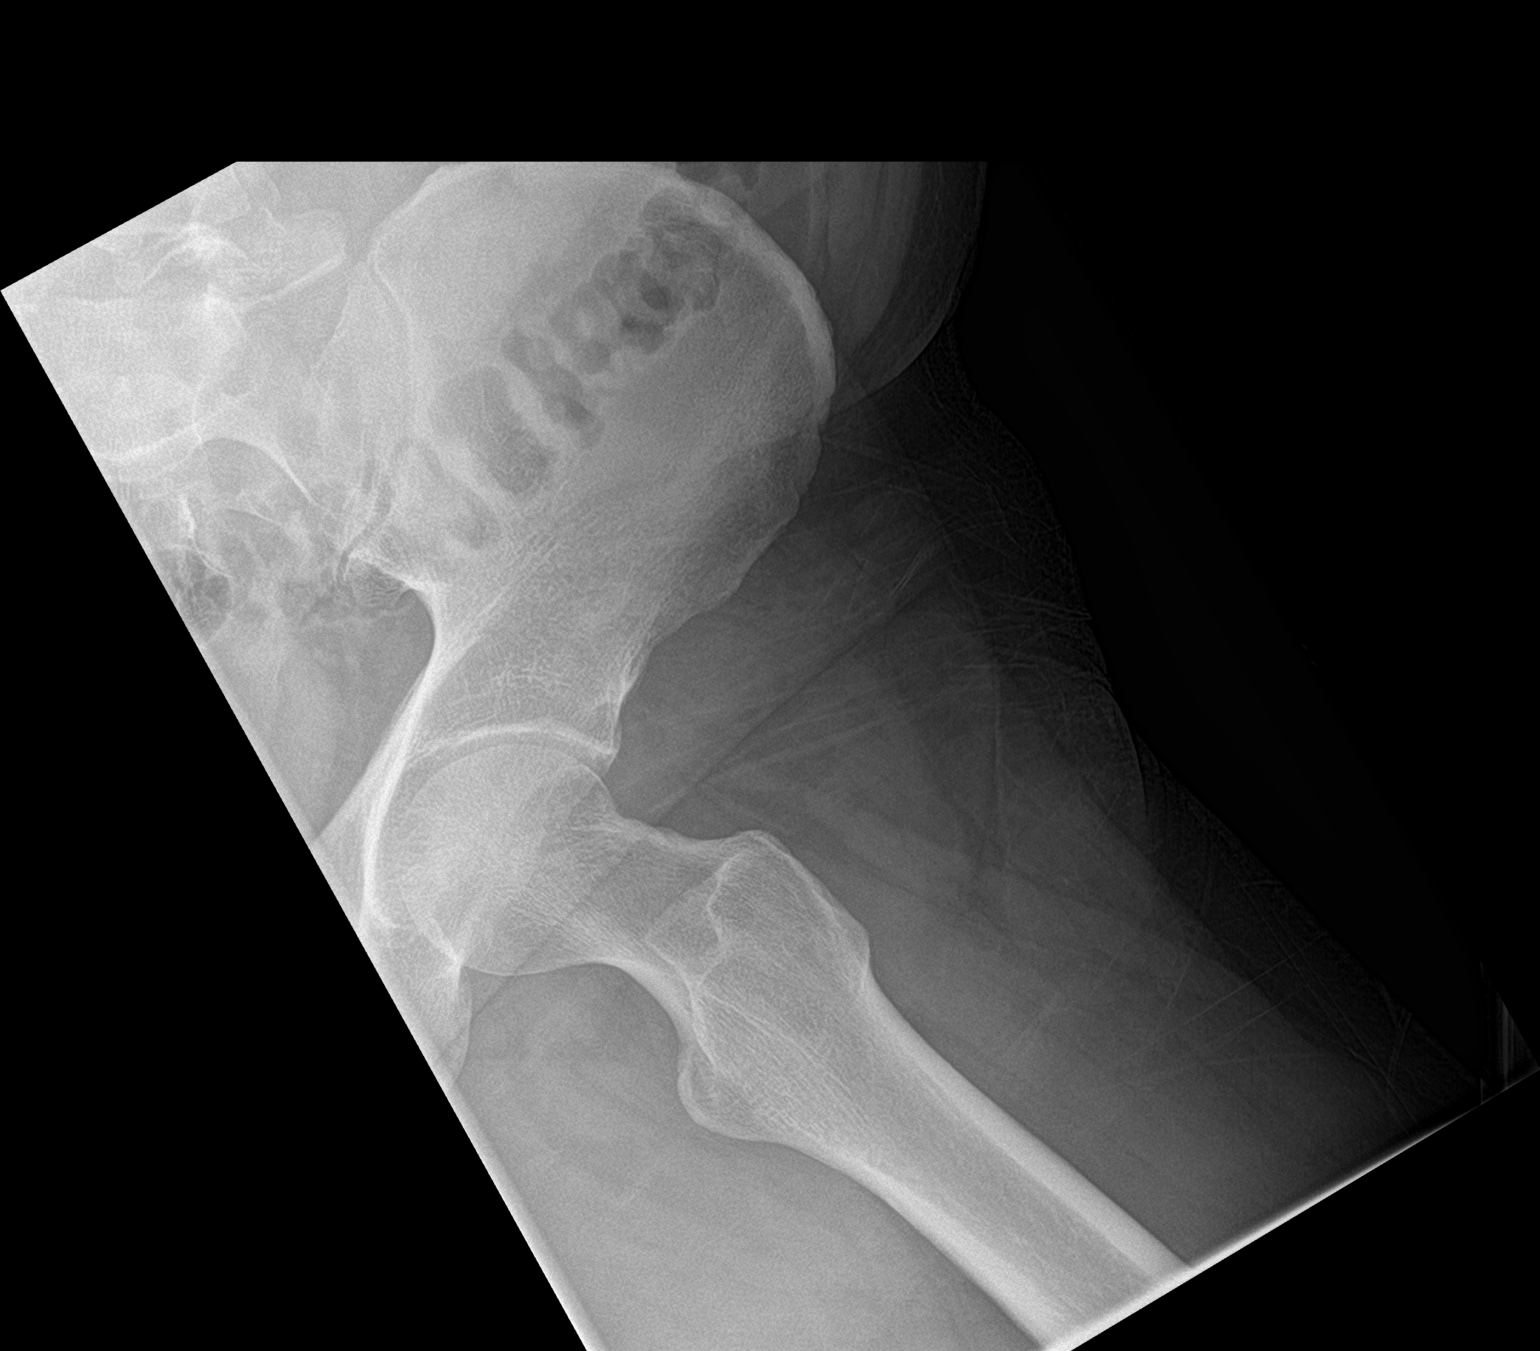

[3 of 3 positions shown; findings below may reference images not displayed]

FINDINGS: Femoral heads are located. Sacroiliac joints are symmetric. Joint
spaces maintained. No acute fracture. No focal osseous lesion.
IMPRESSION: No acute osseous abnormality.

## 2018-10-14 IMAGING — CT CT RENAL STONE PROTOCOL
2 of 4 series · 17 of 46 positions shown, 19 images · non-contrast
Comparison: None

CLINICAL DATA: LEFT flank pain through to back, history of kidney
stones

EXAM:
CT ABDOMEN AND PELVIS WITHOUT CONTRAST
TECHNIQUE: Multidetector CT imaging of the abdomen and pelvis was performed
following the standard protocol without IV contrast. Sagittal and
coronal MPR images reconstructed from axial data set. Oral contrast
not administered for this indication.

[Series 3: renal stone 5.0 · axial · 0.86mm/px · z∈[+1324,+1780]mm · 14 of 101 slices shown, 16 images]
[im 5/101  soft-tissue]
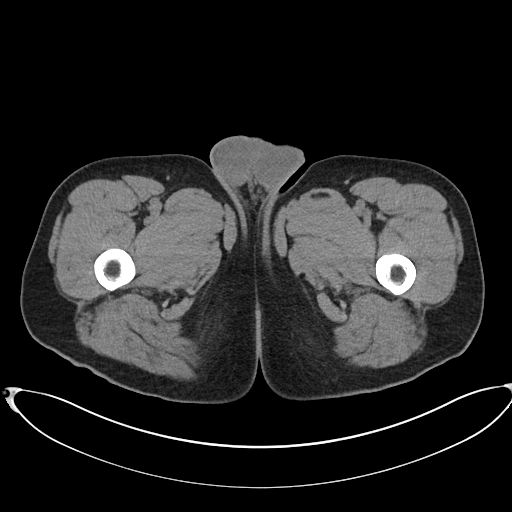
[im 5/101  bone]
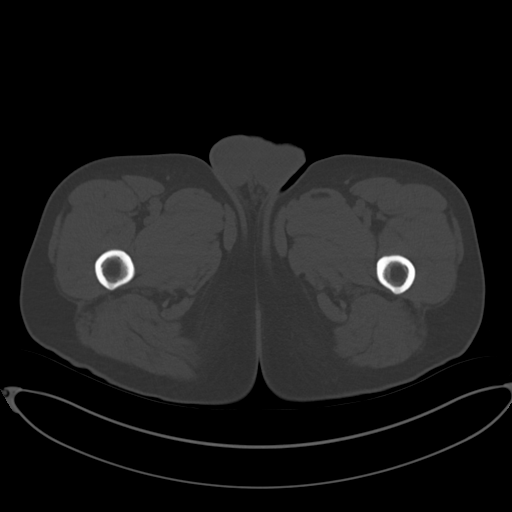
[im 14/101  soft-tissue]
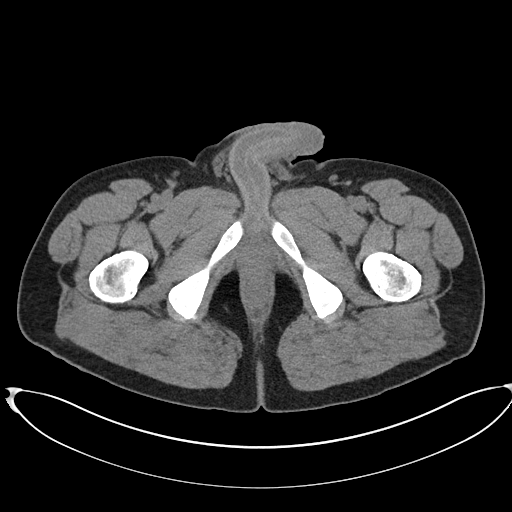
[im 18/101  soft-tissue]
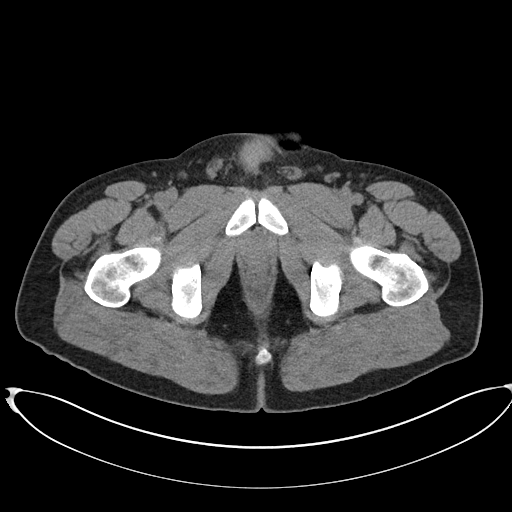
[im 27/101  soft-tissue]
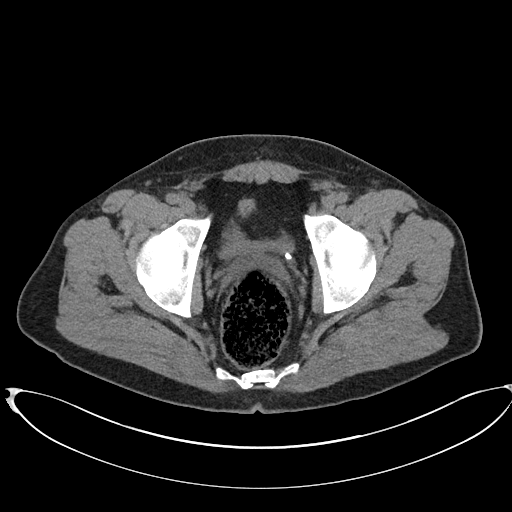
[im 35/101  soft-tissue]
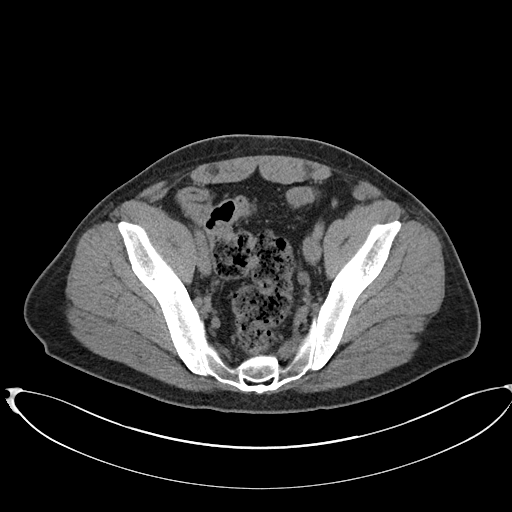
[im 40/101  soft-tissue]
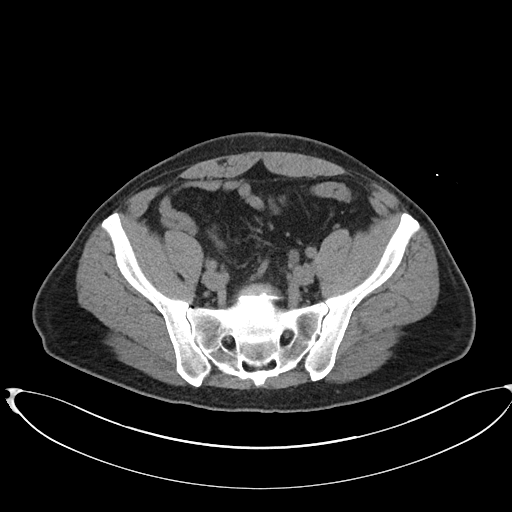
[im 48/101  soft-tissue]
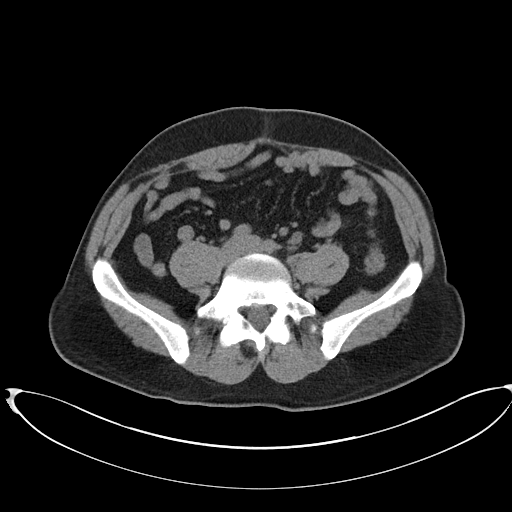
[im 53/101  soft-tissue]
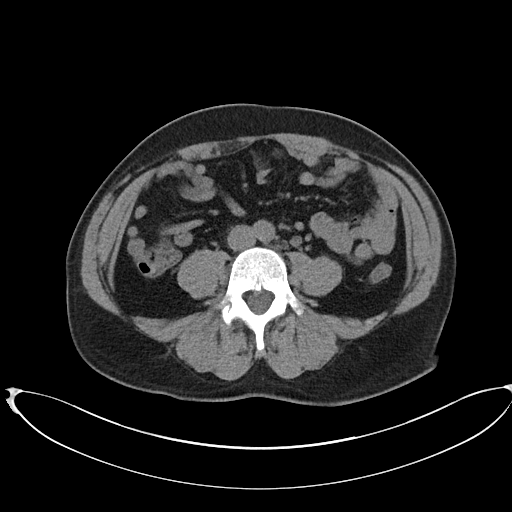
[im 61/101  soft-tissue]
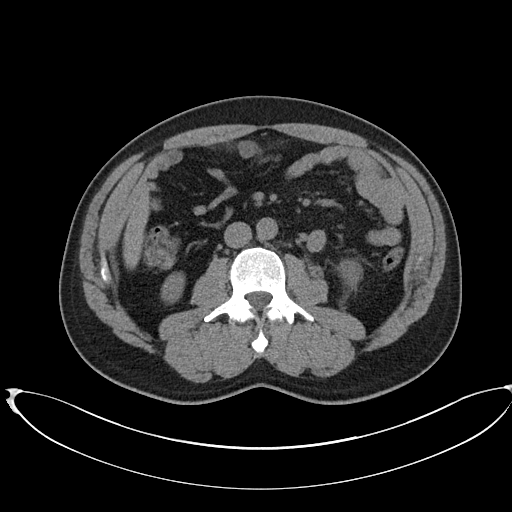
[im 61/101  bone]
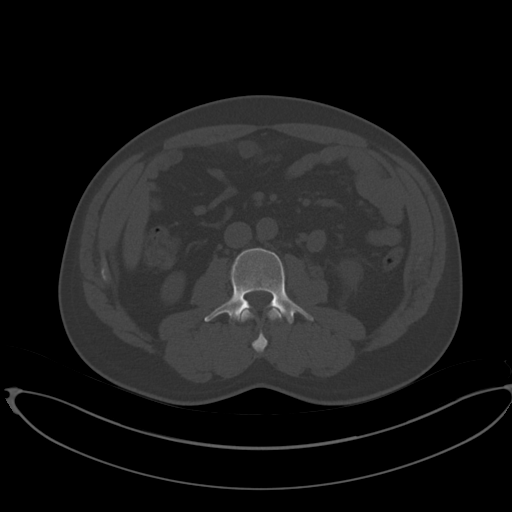
[im 66/101  soft-tissue]
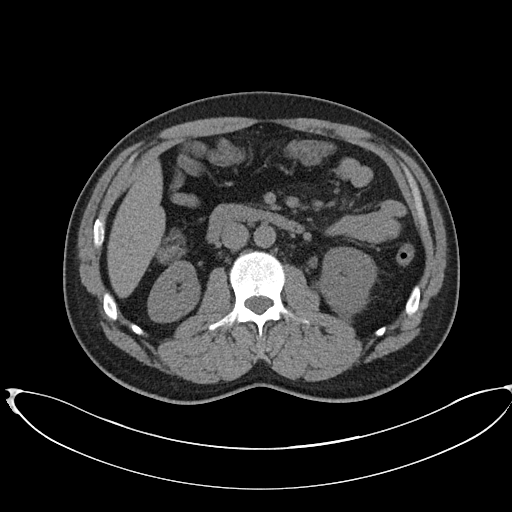
[im 74/101  soft-tissue]
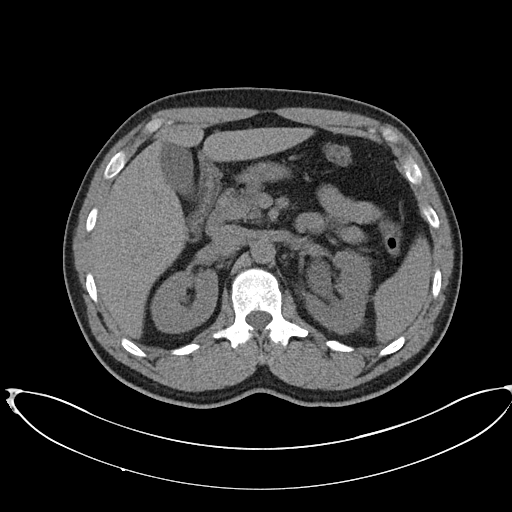
[im 83/101  soft-tissue]
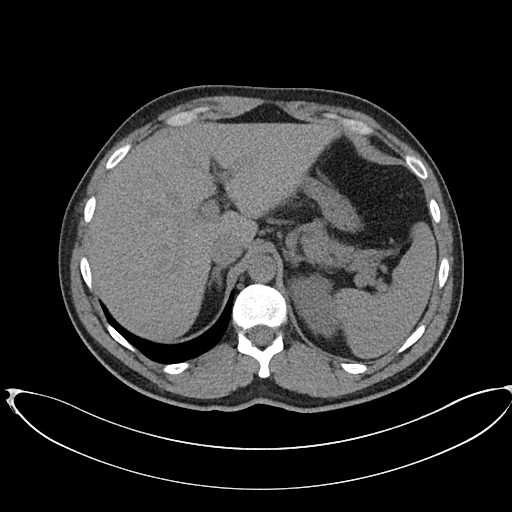
[im 87/101  soft-tissue]
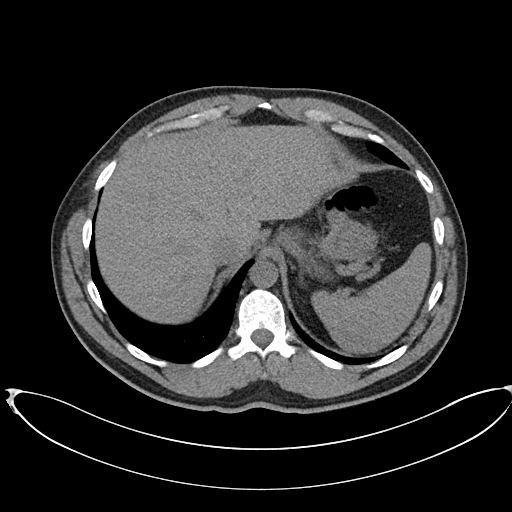
[im 96/101  soft-tissue]
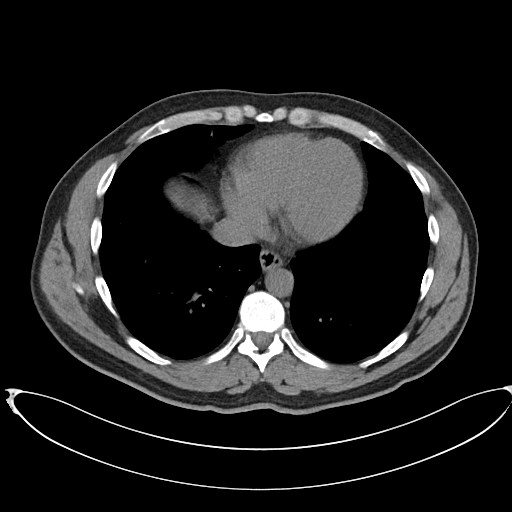

[Series 6: renal stone 3.0 cor · coronal · 0.85mm/px · 3 of 103 slices shown]
[im 35/103  soft-tissue]
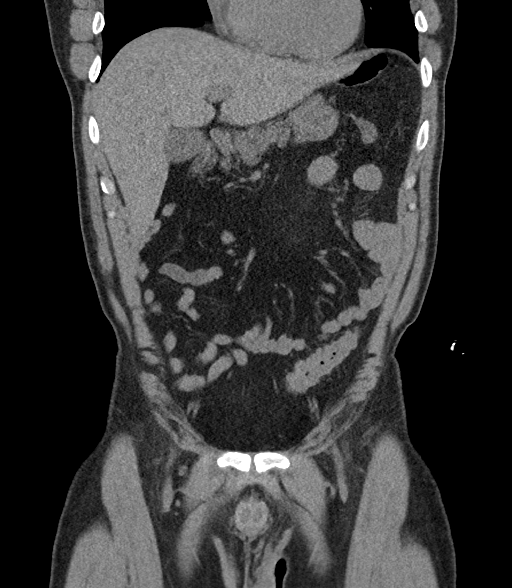
[im 46/103  soft-tissue]
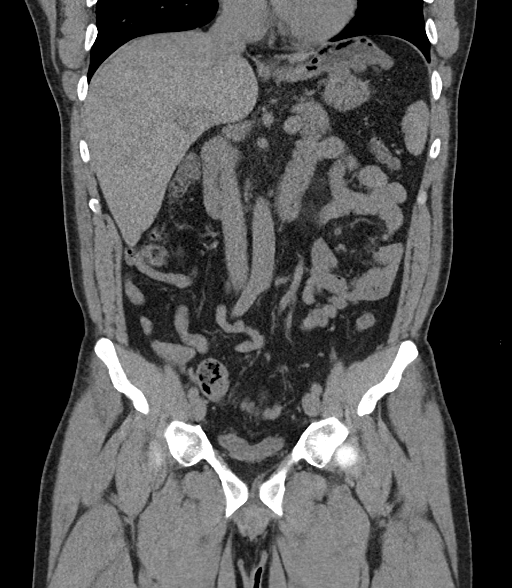
[im 57/103  soft-tissue]
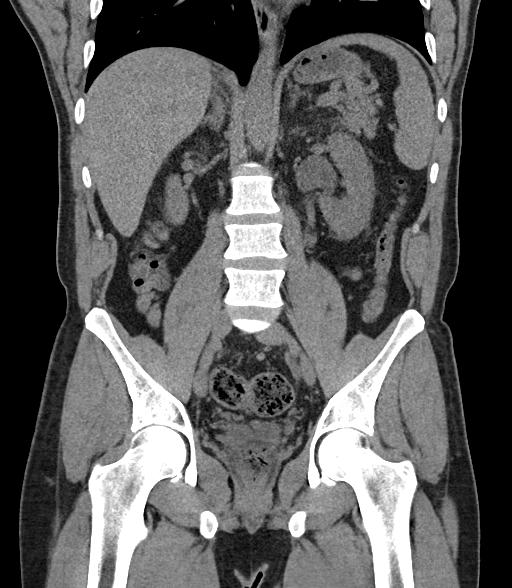

[17 of 46 positions shown; findings below may reference images not displayed]

FINDINGS: Lower chest: Lung bases clear

Hepatobiliary: Liver and gallbladder normal appearance

Pancreas: Normal appearance

Spleen: Normal appearance

Adrenals/Urinary Tract: Adrenal glands and RIGHT kidney normal
appearance. LEFT hydronephrosis and hydroureter secondary to a 6 x 4
mm distal LEFT ureteral calculus very near the ureterovesical
junction. No RIGHT ureteral dilatation or calcification. Bladder
decompressed.

Stomach/Bowel: Mildly increased stool in rectum and distal sigmoid
colon. Question prior appendectomy. Stomach and bowel loops
otherwise normal appearance.

Vascular/Lymphatic: Unremarkable

Reproductive: Unremarkable

Other: No free air or free fluid. No hernia or acute inflammatory
process.

Musculoskeletal: LEFT spondylolysis L5 without spondylolisthesis.
Bones otherwise unremarkable.
IMPRESSION: LEFT hydronephrosis and hydroureter secondary to a 6 x 4 mm distal
LEFT ureteral calculus very near the LEFT ureterovesical junction.

## 2019-07-13 ENCOUNTER — Other Ambulatory Visit: Payer: Self-pay

## 2019-07-13 ENCOUNTER — Ambulatory Visit
Admission: EM | Admit: 2019-07-13 | Discharge: 2019-07-13 | Disposition: A | Discharge status: Home / Self Care | Payer: PRIVATE HEALTH INSURANCE | Attending: Emergency Medicine | Admitting: Emergency Medicine

## 2019-07-13 DIAGNOSIS — H9190 Unspecified hearing loss, unspecified ear: Secondary | ICD-10-CM | POA: Diagnosis not present

## 2019-07-13 DIAGNOSIS — H93292 Other abnormal auditory perceptions, left ear: Secondary | ICD-10-CM | POA: Diagnosis not present

## 2019-07-13 MED ORDER — PREDNISONE 20 MG PO TABS: 20.0000 mg | ORAL_TABLET | Freq: Two times a day (BID) | ORAL | 0 refills | Status: AC

## 2019-07-13 NOTE — ED Triage Notes (Signed)
Pt presents with c/o roaring in ears, and possibly fluid in ears, currently denies pain

## 2019-07-13 NOTE — ED Provider Notes (Signed)
[name]   [id number] 07/13/19 Arrival Time: 1006  CC: Abnormal sound in ear  SUBJECTIVE: History from: patient.  Donald Peck is a 44 y.o. male who presents with of abnormal sound in left ear x 1 day.  Denies a precipitating event, or trauma.  Patient states the sound is constant and sounds like an oscillating fan.  Denies alleviating or aggravating factors.  Reports similar symptoms in the past with ear infection.  Denies fever, chills, fatigue, sinus pain, rhinorrhea, ear pain, ear discharge, muffled hearing, dizziness, sore throat, SOB, wheezing, chest pain, nausea, changes in bowel or bladder habits.    ROS: As per HPI.  All other pertinent ROS negative.     Past Medical History:  Diagnosis Date  . ADD (attention deficit disorder) 12/2016   takes lexapro to help focus  . History of kidney stones   . Kidney stones    Past Surgical History:  Procedure Laterality Date  . APPENDECTOMY  01/03/2001  . CYSTOSCOPY/RETROGRADE/URETEROSCOPY/STONE EXTRACTION WITH BASKET Left 02/08/2017   Procedure: CYSTOSCOPY/LEFT RETROGRADE/LEFT URETEROSCOPY/STONE EXTRACTION WITH BASKET/LEFT URETERAL STENT PLACEMENT;  Surgeon: Raynelle Bring, MD;  Location: [facility];  Service: Urology;  Laterality: Left;  . HOLMIUM LASER APPLICATION Left 72/53/6644   Procedure: HOLMIUM LASER APPLICATION;  Surgeon: Raynelle Bring, MD;  Location: [facility];  Service: Urology;  Laterality: Left;  . ORIF FOREARM FRACTURE Right 1998  . removal of right forearm hardware Right 2003   No Known Allergies No current facility-administered medications on file prior to encounter.   Current Outpatient Medications on File Prior to Encounter  Medication Sig Dispense Refill  . acetaminophen (TYLENOL) 325 MG tablet Take 325-650 mg by mouth every 6 (six) hours as needed (for pain or headaches).    . [DISCONTINUED] escitalopram (LEXAPRO) 10 MG tablet Take 10 mg by mouth daily.  0   Social History   Socioeconomic History    . Marital status: Married    Spouse name: Not on file  . Number of children: Not on file  . Years of education: Not on file  . Highest education level: Not on file  Occupational History  . Not on file  Tobacco Use  . Smoking status: Former Smoker    Packs/day: 1.00    Years: 10.00    Pack years: 10.00    Types: Cigarettes    Quit date: 02/08/2006    Years since quitting: 13.4  . Smokeless tobacco: Never Used  Substance and Sexual Activity  . Alcohol use: Yes    Comment: occassional  . Drug use: No  . Sexual activity: Not on file  Other Topics Concern  . Not on file  Social History Narrative  . Not on file   Social Determinants of Health   Financial Resource Strain:   . Difficulty of Paying Living Expenses:   Food Insecurity:   . Worried About Charity fundraiser in the Last Year:   . Arboriculturist in the Last Year:   Transportation Needs:   . Film/video editor (Medical):   Marland Kitchen Lack of Transportation (Non-Medical):   Physical Activity:   . Days of Exercise per Week:   . Minutes of Exercise per Session:   Stress:   . Feeling of Stress :   Social Connections:   . Frequency of Communication with Friends and Family:   . Frequency of Social Gatherings with Friends and Family:   . Attends Religious Services:   . Active Member of Clubs or  Organizations:   . Attends Banker Meetings:   Marland Kitchen Marital Status:   Intimate Partner Violence:   . Fear of Current or Ex-Partner:   . Emotionally Abused:   Marland Kitchen Physically Abused:   . Sexually Abused:    Family History  Problem Relation Age of Onset  . Healthy Mother   . CAD Father     OBJECTIVE:  Vitals:   07/13/19 1014  BP: (!) 149/92  Pulse: 60  Resp: 20  Temp: 98.3 F (36.8 C)  SpO2: 97%     General appearance: alert; well-appearing, nontoxic HEENT: NCAT; Ears: EACs clear, TMs pearly gray with visible cone of light, without erythema; Eyes: PERRL, EOMI grossly;  Nose: clear rhinorrhea; Throat:  oropharynx clear, tonsils not erythematous or enlarged without white tonsillar exudates, uvula midline Neck: supple without LAD Lungs: unlabored respirations, symmetrical air entry; cough: absent; no respiratory distress Heart: regular rate and rhythm.  Skin: warm and dry Psychological: alert and cooperative; normal mood and affect  ASSESSMENT & PLAN:  1. Perceived hearing changes     Meds ordered this encounter  Medications  . predniSONE (DELTASONE) 20 MG tablet    Sig: Take 1 tablet (20 mg total) by mouth 2 (two) times daily with a meal for 5 days.    Dispense:  10 tablet    Refill:  0    Order Specific Question:   Supervising Provider    Answer:   Eustace Moore [[id number]]    Rest and drink plenty of fluids We will trial a short course of prednisone.   Take medication as directed and to completion Follow up with PCP or ENT if symptoms persists and for further evaluation Return here or go to the ER if you have any new or worsening symptoms fever, chills, nausea, vomiting, ear pain, discharge, changes in hearing, worsening symptoms despite medication, etc...  Reviewed expectations re: course of current medical issues. Questions answered. Outlined signs and symptoms indicating need for more acute intervention. Patient verbalized understanding. After Visit Summary given.         Rennis Harding, PA-C 07/13/19 1055

## 2019-07-13 NOTE — Discharge Instructions (Signed)
Rest and drink plenty of fluids We will trial a short course of prednisone.   Take medication as directed and to completion Follow up with PCP or ENT if symptoms persists and for further evaluation Return here or go to the ER if you have any new or worsening symptoms fever, chills, nausea, vomiting, ear pain, discharge, changes in hearing, worsening symptoms despite medication, etc..Marland Kitchen

## 2019-10-07 NOTE — Progress Notes (Deleted)
[facility]    Provider:  Dr Lucia Gaskins Requesting Provider: Assunta Found, MD Primary Care Provider:  Assunta Found, MD  CC:  ***  HPI:  Donald Peck is a 44 y.o. male here as requested by Assunta Found, MD for ***.  ***  Patient referred by Samara Snide, MD, [facility], note from Sep 05, 2019 said he has tendinosis, not a candidate for distal clavicle excision, painful "arc of motion", he recommended deferral of discussion about arthroscopic surgery, but at last appointment he was talking more about severe numbness in the hands, possibility of carpal tunnel, positive Tinel's sign, positive Phalen's maneuver, bilateral nerve conduction studies were recommended.  Reviewed notes, labs and imaging from outside physicians, which showed ***  Review of Systems: Patient complains of symptoms per HPI as well as the following symptoms ***. Pertinent negatives and positives per HPI. All others negative.   Social History   Socioeconomic History  . Marital status: Married    Spouse name: Not on file  . Number of children: Not on file  . Years of education: Not on file  . Highest education level: Not on file  Occupational History  . Not on file  Tobacco Use  . Smoking status: Former Smoker    Packs/day: 1.00    Years: 10.00    Pack years: 10.00    Types: Cigarettes    Quit date: 02/08/2006    Years since quitting: 13.6  . Smokeless tobacco: Never Used  Substance and Sexual Activity  . Alcohol use: Yes    Comment: occassional  . Drug use: No  . Sexual activity: Not on file  Other Topics Concern  . Not on file  Social History Narrative  . Not on file   Social Determinants of Health   Financial Resource Strain:   . Difficulty of Paying Living Expenses:   Food Insecurity:   . Worried About Programme researcher, broadcasting/film/video in the Last Year:   . Barista in the Last Year:   Transportation Needs:   . Freight forwarder (Medical):   Marland Kitchen  Lack of Transportation (Non-Medical):   Physical Activity:   . Days of Exercise per Week:   . Minutes of Exercise per Session:   Stress:   . Feeling of Stress :   Social Connections:   . Frequency of Communication with Friends and Family:   . Frequency of Social Gatherings with Friends and Family:   . Attends Religious Services:   . Active Member of Clubs or Organizations:   . Attends Banker Meetings:   Marland Kitchen Marital Status:   Intimate Partner Violence:   . Fear of Current or Ex-Partner:   . Emotionally Abused:   Marland Kitchen Physically Abused:   . Sexually Abused:     Family History  Problem Relation Age of Onset  . Healthy Mother   . CAD Father     Past Medical History:  Diagnosis Date  . ADD (attention deficit disorder) 12/2016   takes lexapro to help focus  . History of kidney stones   . Kidney stones     There are no problems to display for this patient.   Past Surgical History:  Procedure Laterality Date  . APPENDECTOMY  01/03/2001  . CYSTOSCOPY/RETROGRADE/URETEROSCOPY/STONE EXTRACTION WITH BASKET Left 02/08/2017   Procedure: CYSTOSCOPY/LEFT RETROGRADE/LEFT URETEROSCOPY/STONE EXTRACTION WITH BASKET/LEFT URETERAL STENT PLACEMENT;  Surgeon: Heloise Purpura, MD;  Location: [facility];  Service: Urology;  Laterality: Left;  . HOLMIUM  LASER APPLICATION Left 10/19/9483   Procedure: HOLMIUM LASER APPLICATION;  Surgeon: Raynelle Bring, MD;  Location: [facility];  Service: Urology;  Laterality: Left;  . ORIF FOREARM FRACTURE Right 1998  . removal of right forearm hardware Right 2003    Current Outpatient Medications  Medication Sig Dispense Refill  . acetaminophen (TYLENOL) 325 MG tablet Take 325-650 mg by mouth every 6 (six) hours as needed (for pain or headaches).     No current facility-administered medications for this visit.    Allergies as of 10/08/2019  . (No Known Allergies)    Vitals: There were no vitals taken for this visit. Last Weight:  Wt Readings from  Last 1 Encounters:  02/08/17 205 lb (93 kg)   Last Height:   Ht Readings from Last 1 Encounters:  02/08/17 6' (1.829 m)     Physical exam: Exam: Gen: NAD, conversant, well nourised, obese, well groomed                     CV: RRR, no MRG. No Carotid Bruits. No peripheral edema, warm, nontender Eyes: Conjunctivae clear without exudates or hemorrhage  Neuro: Detailed Neurologic Exam  Speech:    Speech is normal; fluent and spontaneous with normal comprehension.  Cognition:    The patient is oriented to person, place, and time;     recent and remote memory intact;     language fluent;     normal attention, concentration,     fund of knowledge Cranial Nerves:    The pupils are equal, round, and reactive to light. The fundi are normal and spontaneous venous pulsations are present. Visual fields are full to finger confrontation. Extraocular movements are intact. Trigeminal sensation is intact and the muscles of mastication are normal. The face is symmetric. The palate elevates in the midline. Hearing intact. Voice is normal. Shoulder shrug is normal. The tongue has normal motion without fasciculations.   Coordination:    Normal finger to nose and heel to shin. Normal rapid alternating movements.   Gait:    Heel-toe and tandem gait are normal.   Motor Observation:    No asymmetry, no atrophy, and no involuntary movements noted. Tone:    Normal muscle tone.    Posture:    Posture is normal. normal erect    Strength:    Strength is V/V in the upper and lower limbs.      Sensation: intact to LT     Reflex Exam:  DTR's:    Deep tendon reflexes in the upper and lower extremities are normal bilaterally.   Toes:    The toes are downgoing bilaterally.   Clonus:    Clonus is absent.    Assessment/Plan:    No orders of the defined types were placed in this encounter.  No orders of the defined types were placed in this encounter.   Cc: Sharilyn Sites, MD,  Sharilyn Sites,  MD  Sarina Ill, MD  [facility] [address] [place], [place] [address]  Phone [phone] Fax [phone]

## 2019-10-08 ENCOUNTER — Ambulatory Visit: Payer: PRIVATE HEALTH INSURANCE | Admitting: Neurology

## 2019-10-08 ENCOUNTER — Encounter: Payer: Self-pay | Admitting: Neurology

## 2019-10-08 ENCOUNTER — Telehealth: Payer: Self-pay | Admitting: *Deleted

## 2019-10-08 NOTE — Telephone Encounter (Signed)
Pt no showed new pt appt today. 

## 2020-05-07 ENCOUNTER — Ambulatory Visit (INDEPENDENT_AMBULATORY_CARE_PROVIDER_SITE_OTHER): Payer: PRIVATE HEALTH INSURANCE | Admitting: Diagnostic Neuroimaging

## 2020-05-07 ENCOUNTER — Encounter (INDEPENDENT_AMBULATORY_CARE_PROVIDER_SITE_OTHER): Payer: PRIVATE HEALTH INSURANCE | Admitting: Diagnostic Neuroimaging

## 2020-05-07 DIAGNOSIS — M79601 Pain in right arm: Secondary | ICD-10-CM | POA: Diagnosis not present

## 2020-05-07 DIAGNOSIS — Z0289 Encounter for other administrative examinations: Secondary | ICD-10-CM

## 2020-05-07 DIAGNOSIS — R200 Anesthesia of skin: Secondary | ICD-10-CM

## 2020-05-07 NOTE — Procedures (Signed)
[facility]  NCS (NERVE CONDUCTION STUDY) WITH EMG (ELECTROMYOGRAPHY) REPORT   STUDY DATE: 05/08/19 PATIENT NAME: Donald Peck DOB: 03-13-1976 MRN: [id number]  ORDERING CLINICIAN: Dava Najjar, MD  TECHNOLOGIST: Durenda Age ELECTROMYOGRAPHER: Glenford Bayley. Grason Brailsford, MD  CLINICAL INFORMATION: 45 year old male with right shoulder pain and bilateral hand numbness.  FINDINGS: NERVE CONDUCTION STUDY:  Right median motor response is slightly prolonged distal latency (5.1 ms), normal amplitude and normal conduction velocity.  Left median and bilateral ulnar motor responses are normal.  Right median sensory response is slightly prolonged peak latency and decreased amplitude.  Right radial, right ulnar, left median and left ulnar sensory responses are normal.  Bilateral median to ulnar transcarpal mixed nerve comparison studies show prolonged peak latency differences.  Bilateral ulnar F wave latencies are normal.   NEEDLE ELECTROMYOGRAPHY:  Needle examination of right upper extremity is normal.    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    INTERPRETING PHYSICIAN:  Suanne Marker, MD Certified in Neurology, Neurophysiology and Neuroimaging  [facility] [address] [place], [place] [address] [phone]   [facility]    Nerve / Sites Muscle Latency Ref. Amplitude Ref. Rel Amp Segments Distance Velocity Ref. Area    ms ms mV mV %  cm m/s m/s mVms  R Median - APB     Wrist APB 5.1 ?4.4 7.8 ?4.0 100 Wrist - APB 7   27.4     Upper arm APB 9.4  8.0  103 Upper arm - Wrist 23 53 ?49 27.9  L Median - APB     Wrist APB 3.8 ?4.4 10.3 ?4.0 100 Wrist - APB 7   39.4     Upper arm APB 7.9  10.3  99.9 Upper arm - Wrist 23 56 ?49 38.6  R Ulnar - ADM     Wrist ADM 2.5 ?3.3 12.3  ?6.0 100 Wrist - ADM 7   28.8     B.Elbow ADM 6.3  11.5  93.7 B.Elbow - Wrist 23 61 ?49 27.9     A.Elbow ADM 7.9  10.9  95.1 A.Elbow - B.Elbow 10 62 ?49 27.9  L Ulnar - ADM     Wrist ADM 2.7 ?3.3 13.2 ?6.0 100 Wrist - ADM 7   31.5     B.Elbow ADM 6.2  11.8  89.3 B.Elbow - Wrist 22 64 ?49 29.6     A.Elbow ADM 7.7  11.1  94.1 A.Elbow - B.Elbow 10 64 ?49 29.7             SNC    Nerve / Sites Rec. Site Peak Lat Ref.  Amp Ref. Segments Distance Peak Diff Ref.    ms ms V V  cm ms ms  R Radial - Anatomical snuff box (Forearm)     Forearm Wrist 2.4 ?2.9 15 ?15 Forearm - Wrist 10    R Median, Ulnar - Transcarpal comparison     Median Palm Wrist 2.8 ?2.2 41 ?35 Median Palm - Wrist 8       Ulnar Palm Wrist 1.9 ?2.2 13 ?12 Ulnar Palm - Wrist 8          Median Palm - Ulnar Palm  0.9 ?0.4  L Median, Ulnar - Transcarpal comparison     Median Palm Wrist 2.5 ?2.2 45 ?35 Median Palm - Wrist  8       Ulnar Palm Wrist 1.8 ?2.2 17 ?12 Ulnar Palm - Wrist 8          Median Palm - Ulnar Palm  0.7 ?0.4  R Median - Orthodromic (Dig II, Mid palm)     Dig II Wrist 3.9 ?3.4 8 ?10 Dig II - Wrist 13    L Median - Orthodromic (Dig II, Mid palm)     Dig II Wrist 3.4 ?3.4 12 ?10 Dig II - Wrist 13    R Ulnar - Orthodromic, (Dig V, Mid palm)     Dig V Wrist 2.9 ?3.1 7 ?5 Dig V - Wrist 11    L Ulnar - Orthodromic, (Dig V, Mid palm)     Dig V Wrist 2.6 ?3.1 12 ?5 Dig V - Wrist 10                     F  Wave    Nerve F Lat Ref.   ms ms  R Ulnar - ADM 28.5 ?32.0  L Ulnar - ADM 30.2 ?32.0         EMG Summary Table    Spontaneous MUAP Recruitment  Muscle IA Fib PSW Fasc Other Amp Dur. Poly Pattern  R. Deltoid Normal None None None _______ Normal Normal Normal Normal  R. Biceps brachii Normal None None None _______ Normal Normal Normal Normal  R. Triceps brachii Normal None None None _______ Normal Normal Normal Normal  R. Flexor carpi radialis Normal None None None _______ Normal Normal Normal Normal  R. First  dorsal interosseous Normal None None None _______ Normal Normal Normal Normal

## 2020-05-12 ENCOUNTER — Telehealth: Payer: Self-pay | Admitting: Diagnostic Neuroimaging

## 2020-05-12 NOTE — Telephone Encounter (Signed)
[facility] called, Dr. Madelon Lips need results for Pt's Nerve Conduction. Would like a call from the nurse.

## 2020-05-14 NOTE — Telephone Encounter (Signed)
Done emg report received.

## 2020-05-14 NOTE — Telephone Encounter (Signed)
Emg report completed and sent via fax. -VRP

## 2020-10-02 ENCOUNTER — Other Ambulatory Visit: Payer: Self-pay

## 2020-10-02 ENCOUNTER — Ambulatory Visit (INDEPENDENT_AMBULATORY_CARE_PROVIDER_SITE_OTHER): Payer: PRIVATE HEALTH INSURANCE

## 2020-10-02 ENCOUNTER — Ambulatory Visit (INDEPENDENT_AMBULATORY_CARE_PROVIDER_SITE_OTHER): Payer: No Typology Code available for payment source | Admitting: Podiatry

## 2020-10-02 DIAGNOSIS — M722 Plantar fascial fibromatosis: Secondary | ICD-10-CM

## 2020-10-08 ENCOUNTER — Encounter: Payer: Self-pay | Admitting: Podiatry

## 2020-10-08 NOTE — Progress Notes (Signed)
  Subjective:  Patient ID: Donald Peck, male    DOB: Feb 28, 1976,  MRN: [id number]  Chief Complaint  Patient presents with   Foot Pain    Left heel pain     45 y.o. male presents with the above complaint.    Patient presents with a complaint of right heel pain.  Patient states that it is hurting to his back and he feels like a pulling sensation.  Sharp shooting pain hurts with ambulation.  Hurts with applying pressure.  He has not tried anything for it.  He is tried some over-the-counter medication none of which has helped.  He is tried some conservative treatment options none of which has helped.  He denies any other acute complaints he has not seen and was prior to seeing me. Review of Systems: Negative except as noted in the HPI. Denies N/V/F/Ch.  Past Medical History:  Diagnosis Date   ADD (attention deficit disorder) 12/2016   takes lexapro to help focus   History of kidney stones    Kidney stones     Current Outpatient Medications:    acetaminophen (TYLENOL) 325 MG tablet, Take 325-650 mg by mouth every 6 (six) hours as needed (for pain or headaches)., Disp: , Rfl:    amoxicillin-clavulanate (AUGMENTIN) 500-125 MG tablet, Take 1 tablet by mouth 2 (two) times daily., Disp: , Rfl:    meloxicam (MOBIC) 15 MG tablet, Take 1 tablet by mouth daily., Disp: , Rfl:    predniSONE (DELTASONE) 20 MG tablet, Take 20 mg by mouth 2 (two) times daily., Disp: , Rfl:   Social History   Tobacco Use  Smoking Status Former   Packs/day: 1.00   Years: 10.00   Pack years: 10.00   Types: Cigarettes   Quit date: 02/08/2006   Years since quitting: 14.6  Smokeless Tobacco Never    No Known Allergies Objective:  There were no vitals filed for this visit. There is no height or weight on file to calculate BMI. Constitutional Well developed. Well nourished.  Vascular Dorsalis pedis pulses palpable bilaterally. Posterior tibial pulses palpable bilaterally. Capillary refill normal to all  digits.  No cyanosis or clubbing noted. Pedal hair growth normal.  Neurologic Normal speech. Oriented to person, place, and time. Epicritic sensation to light touch grossly present bilaterally.  Dermatologic Nails well groomed and normal in appearance. No open wounds. No skin lesions.  Orthopedic: Normal joint ROM without pain or crepitus bilaterally. No visible deformities. Tender to palpation at the calcaneal tuber right. No pain with calcaneal squeeze right. Ankle ROM diminished range of motion right. Silfverskiold Test: positive right.   Radiographs: Taken and reviewed. No acute fractures or dislocations. No evidence of stress fracture.  Plantar heel spur present. Posterior heel spur present.   Assessment:   1. Plantar fasciitis of right foot    Plan:  Patient was evaluated and treated and all questions answered.  Plantar Fasciitis, right - XR reviewed as above.  - Educated on icing and stretching. Instructions given.  - Injection delivered to the plantar fascia as below. - DME: Plantar Fascial Brace - Pharmacologic management: None  Procedure: Injection Tendon/Ligament Location: Right plantar fascia at the glabrous junction; medial approach. Skin Prep: alcohol Injectate: 0.5 cc 0.5% marcaine plain, 0.5 cc of 1% Lidocaine, 0.5 cc kenalog 10. Disposition: Patient tolerated procedure well. Injection site dressed with a band-aid.  No follow-ups on file.

## 2020-10-30 ENCOUNTER — Ambulatory Visit (INDEPENDENT_AMBULATORY_CARE_PROVIDER_SITE_OTHER): Payer: No Typology Code available for payment source | Admitting: Podiatry

## 2020-10-30 ENCOUNTER — Other Ambulatory Visit: Payer: Self-pay

## 2020-10-30 DIAGNOSIS — Q666 Other congenital valgus deformities of feet: Secondary | ICD-10-CM | POA: Diagnosis not present

## 2020-10-30 DIAGNOSIS — M722 Plantar fascial fibromatosis: Secondary | ICD-10-CM | POA: Diagnosis not present

## 2020-11-03 ENCOUNTER — Encounter: Payer: Self-pay | Admitting: Podiatry

## 2020-11-03 NOTE — Progress Notes (Signed)
Subjective:  Patient ID: Donald Peck, male    DOB: 06-10-1975,  MRN: [id number]  Chief Complaint  Patient presents with   Plantar Fasciitis    PT stated that he is still having some pain     44 y.o. male presents with the above complaint.    Patient presents with a complaint of left heel pain.  Patient states is doing a little bit better.  Patient states he still has some residual pain.  He is about 70 to 80% improved.  He would like to know if what the next plans are.  He denies any other acute complaints he still hurts to ambulate with the forearm.  He has been wearing his brace on and off Review of Systems: Negative except as noted in the HPI. Denies N/V/F/Ch.  Past Medical History:  Diagnosis Date   ADD (attention deficit disorder) 12/2016   takes lexapro to help focus   History of kidney stones    Kidney stones     Current Outpatient Medications:    acetaminophen (TYLENOL) 325 MG tablet, Take 325-650 mg by mouth every 6 (six) hours as needed (for pain or headaches)., Disp: , Rfl:    amoxicillin-clavulanate (AUGMENTIN) 500-125 MG tablet, Take 1 tablet by mouth 2 (two) times daily., Disp: , Rfl:    meloxicam (MOBIC) 15 MG tablet, Take 1 tablet by mouth daily., Disp: , Rfl:    predniSONE (DELTASONE) 20 MG tablet, Take 20 mg by mouth 2 (two) times daily., Disp: , Rfl:   Social History   Tobacco Use  Smoking Status Former   Packs/day: 1.00   Years: 10.00   Pack years: 10.00   Types: Cigarettes   Quit date: 02/08/2006   Years since quitting: 14.7  Smokeless Tobacco Never    No Known Allergies Objective:  There were no vitals filed for this visit. There is no height or weight on file to calculate BMI. Constitutional Well developed. Well nourished.  Vascular Dorsalis pedis pulses palpable bilaterally. Posterior tibial pulses palpable bilaterally. Capillary refill normal to all digits.  No cyanosis or clubbing noted. Pedal hair growth normal.  Neurologic Normal  speech. Oriented to person, place, and time. Epicritic sensation to light touch grossly present bilaterally.  Dermatologic Nails well groomed and normal in appearance. No open wounds. No skin lesions.  Orthopedic: Normal joint ROM without pain or crepitus bilaterally. No visible deformities. Tender to palpation at the calcaneal tuber left No pain with calcaneal squeeze left Ankle ROM diminished range of motion left Silfverskiold Test: positive left   Radiographs: Taken and reviewed. No acute fractures or dislocations. No evidence of stress fracture.  Plantar heel spur present. Posterior heel spur present.   Assessment:   1. Plantar fasciitis of left foot     Plan:  Patient was evaluated and treated and all questions answered.  Plantar Fasciitis, left - XR reviewed as above.  - Educated on icing and stretching. Instructions given.  - Injection delivered to the plantar fascia as below. - DME: Plantar Fascial Brace - Pharmacologic management: None  Pes planovalgus -I explained to the patient the etiology of pes planovalgus numbers treatment options were extensively discussed.  Given that he has a history of Planter fasciitis in the setting of flatfoot I believe patient will benefit from custom-made orthotics to help control the hindfoot motion support the arch of the foot take the stress away from plantar fascial.  Patient states understanding. -He was casted for orthotics.  Procedure: Injection Tendon/Ligament Location: Left  plantar fascia at the glabrous junction; medial approach. Skin Prep: alcohol Injectate: 0.5 cc 0.5% marcaine plain, 0.5 cc of 1% Lidocaine, 0.5 cc kenalog 10. Disposition: Patient tolerated procedure well. Injection site dressed with a band-aid.  No follow-ups on file.

## 2020-11-27 ENCOUNTER — Other Ambulatory Visit: Payer: Self-pay

## 2020-11-27 ENCOUNTER — Ambulatory Visit (INDEPENDENT_AMBULATORY_CARE_PROVIDER_SITE_OTHER): Payer: No Typology Code available for payment source | Admitting: Podiatry

## 2020-11-27 DIAGNOSIS — Q666 Other congenital valgus deformities of feet: Secondary | ICD-10-CM | POA: Diagnosis not present

## 2020-11-27 DIAGNOSIS — M722 Plantar fascial fibromatosis: Secondary | ICD-10-CM

## 2020-12-01 NOTE — Progress Notes (Signed)
  Subjective:  Patient ID: Donald Peck, male    DOB: 1976-04-16,  MRN: [id number]  Chief Complaint  Patient presents with   Plantar Fasciitis    PT stated that he is doing a lot better     45 y.o. male presents with the above complaint.    Patient presents with a complaint of left heel pain.  Patient presents for follow-up of left Planter fasciitis.  Patient states he is doing a lot better.  His pain is completely resolved.  He would like to discuss prevention technique. Review of Systems: Negative except as noted in the HPI. Denies N/V/F/Ch.  Past Medical History:  Diagnosis Date   ADD (attention deficit disorder) 12/2016   takes lexapro to help focus   History of kidney stones    Kidney stones     Current Outpatient Medications:    acetaminophen (TYLENOL) 325 MG tablet, Take 325-650 mg by mouth every 6 (six) hours as needed (for pain or headaches)., Disp: , Rfl:    amoxicillin-clavulanate (AUGMENTIN) 500-125 MG tablet, Take 1 tablet by mouth 2 (two) times daily., Disp: , Rfl:    meloxicam (MOBIC) 15 MG tablet, Take 1 tablet by mouth daily., Disp: , Rfl:    predniSONE (DELTASONE) 20 MG tablet, Take 20 mg by mouth 2 (two) times daily., Disp: , Rfl:   Social History   Tobacco Use  Smoking Status Former   Packs/day: 1.00   Years: 10.00   Pack years: 10.00   Types: Cigarettes   Quit date: 02/08/2006   Years since quitting: 14.8  Smokeless Tobacco Never    No Known Allergies Objective:  There were no vitals filed for this visit. There is no height or weight on file to calculate BMI. Constitutional Well developed. Well nourished.  Vascular Dorsalis pedis pulses palpable bilaterally. Posterior tibial pulses palpable bilaterally. Capillary refill normal to all digits.  No cyanosis or clubbing noted. Pedal hair growth normal.  Neurologic Normal speech. Oriented to person, place, and time. Epicritic sensation to light touch grossly present bilaterally.  Dermatologic  Nails well groomed and normal in appearance. No open wounds. No skin lesions.  Orthopedic: Normal joint ROM without pain or crepitus bilaterally. No visible deformities. No further tender to palpation at the calcaneal tuber left No pain with calcaneal squeeze left Ankle ROM diminished range of motion left Silfverskiold Test: positive left   Radiographs: Taken and reviewed. No acute fractures or dislocations. No evidence of stress fracture.  Plantar heel spur present. Posterior heel spur present.   Assessment:   1. Plantar fasciitis of left foot   2. Pes planovalgus      Plan:  Patient was evaluated and treated and all questions answered.  Plantar Fasciitis, left -Clinically healed with  2 steroid injection.  I discussed shoe gear modification as well as the importance of orthotics management.  Orthotics were dispensed at today's visit.  I discussed with him that if any foot and ankle issues arise in future come back and see me right away.  He states understanding  Pes planovalgus -I explained to the patient the etiology of pes planovalgus numbers treatment options were extensively discussed.  Given that he has a history of Planter fasciitis in the setting of flatfoot I believe patient will benefit from custom-made orthotics to help control the hindfoot motion support the arch of the foot take the stress away from plantar fascial.  Patient states understanding. -Orthotics were dispensed   No follow-ups on file.

## 2020-12-02 ENCOUNTER — Ambulatory Visit: Payer: PRIVATE HEALTH INSURANCE

## 2021-02-02 ENCOUNTER — Ambulatory Visit
Admission: EM | Admit: 2021-02-02 | Discharge: 2021-02-02 | Disposition: A | Discharge status: Home / Self Care | Payer: No Typology Code available for payment source | Attending: Internal Medicine | Admitting: Internal Medicine

## 2021-02-02 ENCOUNTER — Other Ambulatory Visit: Payer: Self-pay

## 2021-02-02 ENCOUNTER — Encounter: Payer: Self-pay | Admitting: Emergency Medicine

## 2021-02-02 DIAGNOSIS — R058 Other specified cough: Secondary | ICD-10-CM

## 2021-02-02 MED ORDER — CETIRIZINE HCL 10 MG PO TABS: 10.0000 mg | ORAL_TABLET | Freq: Every day | ORAL | 1 refills | Status: AC

## 2021-02-02 MED ORDER — BENZONATATE 100 MG PO CAPS: 100.0000 mg | ORAL_CAPSULE | Freq: Three times a day (TID) | ORAL | 0 refills | Status: AC

## 2021-02-02 MED ORDER — GUAIFENESIN ER 600 MG PO TB12: 600.0000 mg | ORAL_TABLET | Freq: Two times a day (BID) | ORAL | 0 refills | Status: AC

## 2021-02-02 NOTE — Discharge Instructions (Signed)
Please take medications as prescribed Humidifier use will help with symptoms If you have worsening symptoms please return to the urgent care to be reevaluated.

## 2021-02-02 NOTE — ED Provider Notes (Signed)
[facility]    CSN: [id number] Arrival date & time: 02/02/21  1016      History   Chief Complaint No chief complaint on file.   HPI Donald Peck is a 45 y.o. male comes to the urgent care with 3-day history of a chesty cough productive of yellowish sputum.  Patient has a history of seasonal allergies.  Cough is productive of yellowish sputum.  Patient denies any shortness of breath or wheezing.  He also has sore throat with no fever or chills.  No generalized body aches.  No sick contacts.  No dizziness, near syncope or syncopal episodes.  No vomiting.  Patient denies any tobacco use. HPI  Past Medical History:  Diagnosis Date   ADD (attention deficit disorder) 12/2016   takes lexapro to help focus   History of kidney stones    Kidney stones     There are no problems to display for this patient.   Past Surgical History:  Procedure Laterality Date   APPENDECTOMY  01/03/2001   CYSTOSCOPY/RETROGRADE/URETEROSCOPY/STONE EXTRACTION WITH BASKET Left 02/08/2017   Procedure: CYSTOSCOPY/LEFT RETROGRADE/LEFT URETEROSCOPY/STONE EXTRACTION WITH BASKET/LEFT URETERAL STENT PLACEMENT;  Surgeon: Heloise Purpura, MD;  Location: [facility];  Service: Urology;  Laterality: Left;   HOLMIUM LASER APPLICATION Left 02/08/2017   Procedure: HOLMIUM LASER APPLICATION;  Surgeon: Heloise Purpura, MD;  Location: [facility];  Service: Urology;  Laterality: Left;   ORIF FOREARM FRACTURE Right 1998   removal of right forearm hardware Right 2003       Home Medications    Prior to Admission medications   Medication Sig Start Date End Date Taking? Authorizing Provider  benzonatate (TESSALON) 100 MG capsule Take 1 capsule (100 mg total) by mouth every 8 (eight) hours. 02/02/21  Yes Haniel Fix, Britta Mccreedy, MD  cetirizine (ZYRTEC ALLERGY) 10 MG tablet Take 1 tablet (10 mg total) by mouth daily. 02/02/21  Yes Higinio Grow, Britta Mccreedy, MD  guaiFENesin (MUCINEX) 600 MG 12 hr tablet Take 1 tablet (600 mg total) by  mouth 2 (two) times daily for 15 days. 02/02/21 02/17/21 Yes Dyer Klug, Britta Mccreedy, MD  acetaminophen (TYLENOL) 325 MG tablet Take 325-650 mg by mouth every 6 (six) hours as needed (for pain or headaches).    [provider]  escitalopram (LEXAPRO) 10 MG tablet Take 10 mg by mouth daily. 01/28/17 07/13/19  [provider]    Family History Family History  Problem Relation Age of Onset   Healthy Mother    CAD Father     Social History Social History   Tobacco Use   Smoking status: Former    Packs/day: 1.00    Years: 10.00    Pack years: 10.00    Types: Cigarettes    Quit date: 02/08/2006    Years since quitting: 14.9   Smokeless tobacco: Never  Substance Use Topics   Alcohol use: Yes    Comment: occassional   Drug use: No     Allergies   Patient has no known allergies.   Review of Systems Review of Systems  Constitutional: Negative.   HENT:  Positive for congestion, rhinorrhea and sore throat. Negative for sinus pressure and voice change.   Respiratory:  Positive for cough and shortness of breath. Negative for chest tightness and wheezing.   Cardiovascular:  Negative for chest pain and palpitations.  Gastrointestinal: Negative.     Physical Exam Triage Vital Signs ED Triage Vitals  Enc Vitals Group     BP 02/02/21 1021 (!) 156/101  Pulse Rate 02/02/21 1021 64     Resp 02/02/21 1021 18     Temp 02/02/21 1021 97.8 F (36.6 C)     Temp Source 02/02/21 1021 Oral     SpO2 02/02/21 1021 97 %     Weight --      Height --      Head Circumference --      Peak Flow --      Pain Score 02/02/21 1022 0     Pain Loc --      Pain Edu? --      Excl. in GC? --    No data found.  Updated Vital Signs BP (!) 156/101 (BP Location: Right Arm)   Pulse 64   Temp 97.8 F (36.6 C) (Oral)   Resp 18   SpO2 97%   Visual Acuity Right Eye Distance:   Left Eye Distance:   Bilateral Distance:    Right Eye Near:   Left Eye Near:    Bilateral Near:      Physical Exam Vitals and nursing note reviewed.  Constitutional:      General: He is not in acute distress.    Appearance: He is not ill-appearing.  HENT:     Right Ear: Tympanic membrane normal.     Left Ear: Tympanic membrane normal.     Nose: Nose normal.     Mouth/Throat:     Pharynx: No posterior oropharyngeal erythema.  Cardiovascular:     Rate and Rhythm: Normal rate and regular rhythm.     Pulses: Normal pulses.     Heart sounds: Normal heart sounds.  Pulmonary:     Effort: Pulmonary effort is normal.     Breath sounds: Normal breath sounds. No wheezing, rhonchi or rales.  Neurological:     Mental Status: He is alert.     UC Treatments / Results  Labs (all labs ordered are listed, but only abnormal results are displayed) Labs Reviewed - No data to display  EKG   Radiology No results found.  Procedures Procedures (including critical care time)  Medications Ordered in UC Medications - No data to display  Initial Impression / Assessment and Plan / UC Course  I have reviewed the triage vital signs and the nursing notes.  Pertinent labs & imaging results that were available during my care of the patient were reviewed by me and considered in my medical decision making (see chart for details).     1.  Allergic cough: Zyrtec 10 mg orally daily Tessalon Perles as needed for cough Mucinex twice daily Patient is advised to return to urgent care to be reevaluated if symptoms worsen. Patient is further advised to start allergy medications before the allergy season starts Final Clinical Impressions(s) / UC Diagnoses   Final diagnoses:  Allergic cough     Discharge Instructions      Please take medications as prescribed Humidifier use will help with symptoms If you have worsening symptoms please return to the urgent care to be reevaluated.   ED Prescriptions     Medication Sig Dispense Auth. Provider   cetirizine (ZYRTEC ALLERGY) 10 MG tablet Take 1  tablet (10 mg total) by mouth daily. 30 tablet Edmar Blankenburg, Britta Mccreedy, MD   benzonatate (TESSALON) 100 MG capsule Take 1 capsule (100 mg total) by mouth every 8 (eight) hours. 21 capsule Jesyca Weisenburger, Britta Mccreedy, MD   guaiFENesin (MUCINEX) 600 MG 12 hr tablet Take 1 tablet (600 mg total) by mouth 2 (two)  times daily for 15 days. 30 tablet Gesenia Bantz, Britta Mccreedy, MD      PDMP not reviewed this encounter.   Merrilee Jansky, MD 02/02/21 1141

## 2021-02-02 NOTE — ED Triage Notes (Signed)
Chest congestion, productive cough with yellow sputum, sore throat x 3 days.  Hx of bronchitis and sinusitis.

## 2021-06-18 ENCOUNTER — Ambulatory Visit
Admission: EM | Admit: 2021-06-18 | Discharge: 2021-06-18 | Disposition: A | Discharge status: Home / Self Care | Payer: No Typology Code available for payment source | Attending: Family Medicine | Admitting: Family Medicine

## 2021-06-18 ENCOUNTER — Other Ambulatory Visit: Payer: Self-pay

## 2021-06-18 ENCOUNTER — Encounter: Payer: Self-pay | Admitting: Emergency Medicine

## 2021-06-18 DIAGNOSIS — J069 Acute upper respiratory infection, unspecified: Secondary | ICD-10-CM | POA: Diagnosis not present

## 2021-06-18 DIAGNOSIS — J209 Acute bronchitis, unspecified: Secondary | ICD-10-CM

## 2021-06-18 DIAGNOSIS — J3089 Other allergic rhinitis: Secondary | ICD-10-CM | POA: Diagnosis not present

## 2021-06-18 MED ORDER — PREDNISONE 20 MG PO TABS: 40.0000 mg | ORAL_TABLET | Freq: Every day | ORAL | 0 refills | Status: AC

## 2021-06-18 MED ORDER — PROMETHAZINE-DM 6.25-15 MG/5ML PO SYRP: 5.0000 mL | ORAL_SOLUTION | Freq: Four times a day (QID) | ORAL | 0 refills | Status: AC | PRN

## 2021-06-18 NOTE — ED Triage Notes (Signed)
Nasal congestion, coughing x 5 days.  Productive cough with greenish brown sputum.  Has been taking Claritin to treat symptoms

## 2021-06-18 NOTE — ED Provider Notes (Signed)
[facility]    CSN: [id number] Arrival date & time: 06/18/21  1332      History   Chief Complaint No chief complaint on file.   HPI Donald Peck is a 46 y.o. male.   Presenting today with 5-day history of congestion, hacking cough, postnasal drip, sinus pressure.  Denies fever, chills, body aches, chest pain, shortness of breath, abdominal pain, nausea vomiting or diarrhea.  Taking Claritin, Sudafed with mild temporary relief.  No known sick contacts recently.  States he thinks the symptoms are related to having to burn some brush piles the past week or so.  Does have a history of seasonal allergies, no history of chronic pulmonary disease.   Past Medical History:  Diagnosis Date   ADD (attention deficit disorder) 12/2016   takes lexapro to help focus   History of kidney stones    Kidney stones     There are no problems to display for this patient.   Past Surgical History:  Procedure Laterality Date   APPENDECTOMY  01/03/2001   CYSTOSCOPY/RETROGRADE/URETEROSCOPY/STONE EXTRACTION WITH BASKET Left 02/08/2017   Procedure: CYSTOSCOPY/LEFT RETROGRADE/LEFT URETEROSCOPY/STONE EXTRACTION WITH BASKET/LEFT URETERAL STENT PLACEMENT;  Surgeon: Raynelle Bring, MD;  Location: [facility];  Service: Urology;  Laterality: Left;   HOLMIUM LASER APPLICATION Left 0000000   Procedure: HOLMIUM LASER APPLICATION;  Surgeon: Raynelle Bring, MD;  Location: [facility];  Service: Urology;  Laterality: Left;   ORIF FOREARM FRACTURE Right 1998   removal of right forearm hardware Right 2003       Home Medications    Prior to Admission medications   Medication Sig Start Date End Date Taking? Authorizing Provider  predniSONE (DELTASONE) 20 MG tablet Take 2 tablets (40 mg total) by mouth daily with breakfast. 06/18/21  Yes Volney American, PA-C  promethazine-dextromethorphan (PROMETHAZINE-DM) 6.25-15 MG/5ML syrup Take 5 mLs by mouth 4 (four) times daily as needed. 06/18/21  Yes Volney American, PA-C  acetaminophen (TYLENOL) 325 MG tablet Take 325-650 mg by mouth every 6 (six) hours as needed (for pain or headaches).    [provider]  benzonatate (TESSALON) 100 MG capsule Take 1 capsule (100 mg total) by mouth every 8 (eight) hours. 02/02/21   Lamptey, Myrene Galas, MD  cetirizine (ZYRTEC ALLERGY) 10 MG tablet Take 1 tablet (10 mg total) by mouth daily. 02/02/21   Chase Picket, MD  escitalopram (LEXAPRO) 10 MG tablet Take 10 mg by mouth daily. 01/28/17 07/13/19  [provider]    Family History Family History  Problem Relation Age of Onset   Healthy Mother    CAD Father     Social History Social History   Tobacco Use   Smoking status: Former    Packs/day: 1.00    Years: 10.00    Pack years: 10.00    Types: Cigarettes    Quit date: 02/08/2006    Years since quitting: 15.3   Smokeless tobacco: Never  Substance Use Topics   Alcohol use: Yes    Comment: occassional   Drug use: No     Allergies   Patient has no known allergies.   Review of Systems Review of Systems Per HPI  Physical Exam Triage Vital Signs ED Triage Vitals  Enc Vitals Group     BP 06/18/21 1355 (!) 155/98     Pulse Rate 06/18/21 1355 (!) 112     Resp 06/18/21 1355 18     Temp 06/18/21 1355 98.2 F (36.8 C)  Temp Source 06/18/21 1355 Oral     SpO2 06/18/21 1355 97 %     Weight --      Height --      Head Circumference --      Peak Flow --      Pain Score 06/18/21 1356 0     Pain Loc --      Pain Edu? --      Excl. in GC? --    No data found.  Updated Vital Signs BP (!) 155/98 (BP Location: Right Arm)    Pulse (!) 112    Temp 98.2 F (36.8 C) (Oral)    Resp 18    SpO2 97%   Visual Acuity Right Eye Distance:   Left Eye Distance:   Bilateral Distance:    Right Eye Near:   Left Eye Near:    Bilateral Near:     Physical Exam Vitals and nursing note reviewed.  Constitutional:      Appearance: Normal appearance.  HENT:     Head:  Atraumatic.     Right Ear: Tympanic membrane normal.     Left Ear: Tympanic membrane normal.     Nose: Rhinorrhea present.     Mouth/Throat:     Mouth: Mucous membranes are moist.     Pharynx: Posterior oropharyngeal erythema present. No oropharyngeal exudate.  Eyes:     Extraocular Movements: Extraocular movements intact.     Conjunctiva/sclera: Conjunctivae normal.  Cardiovascular:     Rate and Rhythm: Normal rate and regular rhythm.  Pulmonary:     Effort: Pulmonary effort is normal.     Breath sounds: Normal breath sounds. No wheezing or rales.  Musculoskeletal:        General: Normal range of motion.     Cervical back: Normal range of motion and neck supple.  Skin:    General: Skin is warm and dry.  Neurological:     General: No focal deficit present.     Mental Status: He is oriented to person, place, and time.     Motor: No weakness.     Gait: Gait normal.  Psychiatric:        Mood and Affect: Mood normal.        Thought Content: Thought content normal.        Judgment: Judgment normal.     UC Treatments / Results  Labs (all labs ordered are listed, but only abnormal results are displayed) Labs Reviewed - No data to display  EKG   Radiology No results found.  Procedures Procedures (including critical care time)  Medications Ordered in UC Medications - No data to display  Initial Impression / Assessment and Plan / UC Course  I have reviewed the triage vital signs and the nursing notes.  Pertinent labs & imaging results that were available during my care of the patient were reviewed by me and considered in my medical decision making (see chart for details).     Tachycardic in triage, otherwise vital signs reassuring.  Suspect seasonal allergy exacerbation causing bronchitis and sinus inflammation.  Treat with prednisone, Phenergan DM, allergy regimen, sinus rinses and return for acutely worsening symptoms.  Final Clinical Impressions(s) / UC Diagnoses    Final diagnoses:  Acute bronchitis, unspecified organism  Seasonal allergic rhinitis due to other allergic trigger  Upper respiratory tract infection, unspecified type   Discharge Instructions   None    ED Prescriptions     Medication Sig Dispense Auth. Provider  predniSONE (DELTASONE) 20 MG tablet Take 2 tablets (40 mg total) by mouth daily with breakfast. 10 tablet Volney American, PA-C   promethazine-dextromethorphan (PROMETHAZINE-DM) 6.25-15 MG/5ML syrup Take 5 mLs by mouth 4 (four) times daily as needed. 100 mL Volney American, [place]      PDMP not reviewed this encounter.   Volney American, [place] 06/18/21 [phone]

## 2022-05-26 ENCOUNTER — Encounter: Payer: Self-pay | Admitting: Emergency Medicine

## 2022-05-26 ENCOUNTER — Ambulatory Visit
Admission: EM | Admit: 2022-05-26 | Discharge: 2022-05-26 | Disposition: A | Discharge status: Home / Self Care | Payer: PRIVATE HEALTH INSURANCE | Attending: Family Medicine | Admitting: Family Medicine

## 2022-05-26 DIAGNOSIS — H1032 Unspecified acute conjunctivitis, left eye: Secondary | ICD-10-CM

## 2022-05-26 MED ORDER — POLYMYXIN B-TRIMETHOPRIM 10000-0.1 UNIT/ML-% OP SOLN: 1.0000 [drp] | Freq: Four times a day (QID) | OPHTHALMIC | 0 refills | Status: AC

## 2022-05-26 NOTE — ED Triage Notes (Signed)
Left eye redness and drainage since yesterday.

## 2022-05-26 NOTE — ED Provider Notes (Signed)
[facility]    CSN: [id number] Arrival date & time: 05/26/22  1403      History   Chief Complaint No chief complaint on file.   HPI Donald Peck is a 47 y.o. male.   Patient presenting today with 1 day history of left eye redness, drainage, itching, irritation.  Denies visual change, injury to the eye, headache, fever, nausea, vomiting.  Trying warm compresses with minimal relief so far.    Past Medical History:  Diagnosis Date   ADD (attention deficit disorder) 12/2016   takes lexapro to help focus   History of kidney stones    Kidney stones     There are no problems to display for this patient.   Past Surgical History:  Procedure Laterality Date   APPENDECTOMY  01/03/2001   CYSTOSCOPY/RETROGRADE/URETEROSCOPY/STONE EXTRACTION WITH BASKET Left 02/08/2017   Procedure: CYSTOSCOPY/LEFT RETROGRADE/LEFT URETEROSCOPY/STONE EXTRACTION WITH BASKET/LEFT URETERAL STENT PLACEMENT;  Surgeon: Raynelle Bring, MD;  Location: [facility];  Service: Urology;  Laterality: Left;   HOLMIUM LASER APPLICATION Left 87/56/4332   Procedure: HOLMIUM LASER APPLICATION;  Surgeon: Raynelle Bring, MD;  Location: [facility];  Service: Urology;  Laterality: Left;   ORIF FOREARM FRACTURE Right 1998   removal of right forearm hardware Right 2003       Home Medications    Prior to Admission medications   Medication Sig Start Date End Date Taking? Authorizing Provider  trimethoprim-polymyxin b (POLYTRIM) ophthalmic solution Place 1 drop into the left eye every 6 (six) hours. 05/26/22  Yes Volney American, PA-C  acetaminophen (TYLENOL) 325 MG tablet Take 325-650 mg by mouth every 6 (six) hours as needed (for pain or headaches).    [provider]  benzonatate (TESSALON) 100 MG capsule Take 1 capsule (100 mg total) by mouth every 8 (eight) hours. 02/02/21   Lamptey, Myrene Galas, MD  cetirizine (ZYRTEC ALLERGY) 10 MG tablet Take 1 tablet (10 mg total) by mouth daily. 02/02/21   Chase Picket, MD  predniSONE (DELTASONE) 20 MG tablet Take 2 tablets (40 mg total) by mouth daily with breakfast. 06/18/21   Volney American, PA-C  promethazine-dextromethorphan (PROMETHAZINE-DM) 6.25-15 MG/5ML syrup Take 5 mLs by mouth 4 (four) times daily as needed. 06/18/21   Volney American, PA-C  escitalopram (LEXAPRO) 10 MG tablet Take 10 mg by mouth daily. 01/28/17 07/13/19  [provider]    Family History Family History  Problem Relation Age of Onset   Healthy Mother    CAD Father     Social History Social History   Tobacco Use   Smoking status: Former    Packs/day: 1.00    Years: 10.00    Total pack years: 10.00    Types: Cigarettes    Quit date: 02/08/2006    Years since quitting: 16.3   Smokeless tobacco: Never  Substance Use Topics   Alcohol use: Yes    Comment: occassional   Drug use: No     Allergies   Patient has no known allergies.   Review of Systems Review of Systems PER HPI  Physical Exam Triage Vital Signs ED Triage Vitals  Enc Vitals Group     BP 05/26/22 1421 (!) 146/90     Pulse Rate 05/26/22 1421 96     Resp 05/26/22 1421 18     Temp 05/26/22 1421 98.4 F (36.9 C)     Temp Source 05/26/22 1421 Oral     SpO2 05/26/22 1421 94 %  Weight --      Height --      Head Circumference --      Peak Flow --      Pain Score 05/26/22 1422 0     Pain Loc --      Pain Edu? --      Excl. in [place]? --    No data found.  Updated Vital Signs BP (!) 146/90 (BP Location: Right Arm)   Pulse 96   Temp 98.4 F (36.9 C) (Oral)   Resp 18   SpO2 94%   Visual Acuity Right Eye Distance:   Left Eye Distance:   Bilateral Distance:    Right Eye Near:   Left Eye Near:    Bilateral Near:     Physical Exam Vitals and nursing note reviewed.  Constitutional:      Appearance: Normal appearance.  HENT:     Head: Atraumatic.     Mouth/Throat:     Mouth: Mucous membranes are moist.  Eyes:     Extraocular Movements: Extraocular  movements intact.     Pupils: Pupils are equal, round, and reactive to light.     Comments: Left conjunctival erythema, injection, drainage  Cardiovascular:     Rate and Rhythm: Normal rate and regular rhythm.  Pulmonary:     Effort: Pulmonary effort is normal.     Breath sounds: Normal breath sounds.  Musculoskeletal:        General: Normal range of motion.     Cervical back: Normal range of motion and neck supple.  Skin:    General: Skin is warm and dry.  Neurological:     General: No focal deficit present.     Mental Status: He is oriented to person, place, and time.  Psychiatric:        Mood and Affect: Mood normal.        Thought Content: Thought content normal.        Judgment: Judgment normal.      UC Treatments / Results  Labs (all labs ordered are listed, but only abnormal results are displayed) Labs Reviewed - No data to display  EKG   Radiology No results found.  Procedures Procedures (including critical care time)  Medications Ordered in UC Medications - No data to display  Initial Impression / Assessment and Plan / UC Course  I have reviewed the triage vital signs and the nursing notes.  Pertinent labs & imaging results that were available during my care of the patient were reviewed by me and considered in my medical decision making (see chart for details).     Visual acuity declined today as vision intact per patient, treat with Polytrim drops, warm compresses, good hand hygiene.  Return for worsening symptoms.  Final Clinical Impressions(s) / UC Diagnoses   Final diagnoses:  Acute conjunctivitis of left eye, unspecified acute conjunctivitis type   Discharge Instructions   None    ED Prescriptions     Medication Sig Dispense Auth. Provider   trimethoprim-polymyxin b (POLYTRIM) ophthalmic solution Place 1 drop into the left eye every 6 (six) hours. 10 mL Volney American, [place]      PDMP not reviewed this encounter.   Volney American, [place] 05/26/22 1445

## 2023-06-07 ENCOUNTER — Ambulatory Visit: Payer: Self-pay
